# Patient Record
Sex: Female | Born: 1950 | Race: Black or African American | Hispanic: No | Smoking: Former smoker
Health system: Southern US, Community
[De-identification: ages and names within clinical notes are randomized; demographics above are authoritative.]

## PROBLEM LIST (undated history)

## (undated) DIAGNOSIS — R350 Frequency of micturition: Secondary | ICD-10-CM

## (undated) DIAGNOSIS — D649 Anemia, unspecified: Secondary | ICD-10-CM

## (undated) DIAGNOSIS — R3129 Other microscopic hematuria: Secondary | ICD-10-CM

## (undated) DIAGNOSIS — I10 Essential (primary) hypertension: Secondary | ICD-10-CM

## (undated) DIAGNOSIS — R32 Unspecified urinary incontinence: Secondary | ICD-10-CM

## (undated) HISTORY — DX: Unspecified urinary incontinence: R32

## (undated) HISTORY — DX: Frequency of micturition: R35.0

## (undated) HISTORY — DX: Other microscopic hematuria: R31.29

---

## 2011-06-06 ENCOUNTER — Ambulatory Visit: Payer: Self-pay | Admitting: Family Medicine

## 2013-12-03 ENCOUNTER — Ambulatory Visit: Payer: Self-pay | Admitting: Family Medicine

## 2013-12-14 ENCOUNTER — Ambulatory Visit: Payer: Self-pay | Admitting: Family Medicine

## 2014-07-15 ENCOUNTER — Other Ambulatory Visit: Payer: Self-pay | Admitting: Urology

## 2014-07-15 DIAGNOSIS — R3129 Other microscopic hematuria: Secondary | ICD-10-CM

## 2014-07-21 ENCOUNTER — Other Ambulatory Visit: Payer: Self-pay | Admitting: *Deleted

## 2014-07-21 ENCOUNTER — Ambulatory Visit
Admission: RE | Admit: 2014-07-21 | Discharge: 2014-07-21 | Disposition: A | Discharge status: Home / Self Care | Payer: PPO | Source: Ambulatory Visit | Attending: Urology | Admitting: Urology

## 2014-07-21 ENCOUNTER — Encounter: Payer: Self-pay | Admitting: *Deleted

## 2014-07-21 DIAGNOSIS — N200 Calculus of kidney: Secondary | ICD-10-CM | POA: Insufficient documentation

## 2014-07-21 DIAGNOSIS — M5137 Other intervertebral disc degeneration, lumbosacral region: Secondary | ICD-10-CM | POA: Diagnosis not present

## 2014-07-21 DIAGNOSIS — R3129 Other microscopic hematuria: Secondary | ICD-10-CM

## 2014-07-21 DIAGNOSIS — D649 Anemia, unspecified: Secondary | ICD-10-CM | POA: Insufficient documentation

## 2014-07-21 DIAGNOSIS — I7090 Unspecified atherosclerosis: Secondary | ICD-10-CM | POA: Insufficient documentation

## 2014-07-21 DIAGNOSIS — Z5320 Procedure and treatment not carried out because of patient's decision for unspecified reasons: Secondary | ICD-10-CM | POA: Insufficient documentation

## 2014-07-21 DIAGNOSIS — R312 Other microscopic hematuria: Secondary | ICD-10-CM | POA: Diagnosis present

## 2014-07-21 DIAGNOSIS — I10 Essential (primary) hypertension: Secondary | ICD-10-CM | POA: Insufficient documentation

## 2014-07-21 HISTORY — DX: Essential (primary) hypertension: I10

## 2014-07-21 MED ORDER — IOHEXOL 300 MG/ML  SOLN
125.0000 mL | Freq: Once | INTRAMUSCULAR | Status: AC | PRN
  Administered 2014-07-21: 150 mL via INTRAVENOUS

## 2014-07-28 ENCOUNTER — Encounter: Payer: Self-pay | Admitting: Urology

## 2014-07-28 ENCOUNTER — Ambulatory Visit (INDEPENDENT_AMBULATORY_CARE_PROVIDER_SITE_OTHER): Payer: PPO | Admitting: Urology

## 2014-07-28 VITALS — BP 117/78 | HR 79 | Ht 63.0 in | Wt 180.7 lb

## 2014-07-28 DIAGNOSIS — R3129 Other microscopic hematuria: Secondary | ICD-10-CM

## 2014-07-28 DIAGNOSIS — R312 Other microscopic hematuria: Secondary | ICD-10-CM

## 2014-07-28 DIAGNOSIS — R300 Dysuria: Secondary | ICD-10-CM

## 2014-07-28 LAB — URINALYSIS, COMPLETE
BILIRUBIN UA: NEGATIVE
GLUCOSE, UA: NEGATIVE
Ketones, UA: NEGATIVE
Leukocytes, UA: NEGATIVE
NITRITE UA: NEGATIVE
SPEC GRAV UA: 1.025 (ref 1.005–1.030)
Urobilinogen, Ur: 0.2 mg/dL (ref 0.2–1.0)
pH, UA: 5.5 (ref 5.0–7.5)

## 2014-07-28 LAB — MICROSCOPIC EXAMINATION: Bacteria, UA: NONE SEEN

## 2014-07-28 MED ORDER — URIBEL 118 MG PO CAPS
118.0000 mg | ORAL_CAPSULE | Freq: Three times a day (TID) | ORAL | Status: DC
Start: 2014-07-28 — End: 2014-09-13

## 2014-07-28 NOTE — Progress Notes (Signed)
   Subjective:    Patient ID: Barbara Lewis, female    DOB: May 07, 1950, 64 y.o.   MRN: [id number]  HPI Patient is a 64 year old female with a smoking history of 30 pack years of smoking [place] unfiltered cigarettes. She presents with number of small growths on the posterior portion of her bladder there will need to be biopsied. On the trigone is an area of erythema that I will treat with URI BEL. Otherwise the bladder appeared normal the ureters were in normal position with a clear reflux of urine through each her etc. be in my body of my report  Review of Systems  genitourinary-microhematuria urgency frequency and dysuria Abdominal-no constipation diarrhea or food intolerance Psychiatric no depression positive for anxiety        Physical Exam  Constitutional: She appears well-developed and well-nourished.  Abdominal: Soft. Bowel sounds are normal.  Genitourinary: Vagina normal.  Musculoskeletal: She exhibits no edema or tenderness.      Cystoscopy Procedure Note  Patient identification was confirmed, informed consent was obtained, and patient was prepped using Betadine solution.  Lidocaine jelly was administered per urethral meatus.    Preoperative abx where received prior to procedure.    Procedure: - Flexible cystoscope introduced, without any difficulty.   - Thorough search of the bladder revealed:    normal urethral meatus   Erythematous urothelium and urothelium that is normal on the trigone and bladder. 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  Post-Procedure: - Patient tolerated the procedure well        Assessment & Plan:  As the patient has a 3 tumors the posterior wall I will put her on the OR schedule for outpatient cystoscopy cystoscopy bladder biopsy. She has been  put on your about for the irritation to her trigone and the dysuria created by this irritation.

## 2014-08-02 ENCOUNTER — Other Ambulatory Visit: Payer: Self-pay | Admitting: Urology

## 2014-08-03 NOTE — Telephone Encounter (Signed)
I spoke w/ Mr. Nored and notified him that his [place] will be a phone encounter scheduled for Monday, July 11th 9:00-12:00pm. He verbally express understanding.

## 2014-09-06 ENCOUNTER — Encounter
Admission: RE | Admit: 2014-09-06 | Discharge: 2014-09-06 | Disposition: A | Discharge status: Home / Self Care | Payer: PPO | Source: Ambulatory Visit | Attending: Urology | Admitting: Urology

## 2014-09-06 DIAGNOSIS — Z01810 Encounter for preprocedural cardiovascular examination: Secondary | ICD-10-CM | POA: Diagnosis present

## 2014-09-06 DIAGNOSIS — N329 Bladder disorder, unspecified: Secondary | ICD-10-CM | POA: Insufficient documentation

## 2014-09-06 DIAGNOSIS — I10 Essential (primary) hypertension: Secondary | ICD-10-CM | POA: Diagnosis not present

## 2014-09-06 DIAGNOSIS — Z01812 Encounter for preprocedural laboratory examination: Secondary | ICD-10-CM | POA: Insufficient documentation

## 2014-09-06 HISTORY — DX: Anemia, unspecified: D64.9

## 2014-09-06 LAB — POTASSIUM: Potassium: 4.1 mmol/L (ref 3.5–5.1)

## 2014-09-06 NOTE — Patient Instructions (Addendum)
  Your procedure is scheduled on:09/13/14 Report to Day Surgery. [facility] To find out your arrival time please call [phone] between 1PM - 3PM on 09/10/14 Remember: Instructions that are not followed completely may result in serious medical risk, up to and including death, or upon the discretion of your surgeon and anesthesiologist your surgery may need to be rescheduled.    _X__ 1. Do not eat food or drink liquids after midnight. No gum chewing or hard candies.     __X__ 2. No Alcohol for 24 hours before or after surgery.   ____ 3. Bring all medications with you on the day of surgery if instructed.    __X__ 4. Notify your doctor if there is any change in your medical condition     (cold, fever, infections).     Do not wear jewelry, make-up, hairpins, clips or nail polish.  Do not wear lotions, powders, or perfumes. You may wear deodorant.  Do not shave 48 hours prior to surgery. Men may shave face and neck.  Do not bring valuables to the hospital.    [facility] is not responsible for any belongings or valuables.               Contacts, dentures or bridgework may not be worn into surgery.  Leave your suitcase in the car. After surgery it may be brought to your room.  For patients admitted to the hospital, discharge time is determined by your                treatment team.   Patients discharged the day of surgery will not be allowed to drive home.   Please read over the following fact sheets that you were given:   Surgical Site Infection Prevention   ____ Take these medicines the morning of surgery with A SIP OF WATER:    1.   2.   3.   4.  5.  6.  ____ Fleet Enema (as directed)   ____ Use CHG Soap as directed  ____ Use inhalers on the day of surgery  ____ Stop metformin 2 days prior to surgery    ____ Take 1/2 of usual insulin dose the night before surgery and none on the morning of surgery.   ____ Stop Coumadin/Plavix/aspirin on  ____ Stop  Anti-inflammatories on   ____ Stop supplements until after surgery.    ____ Bring C-Pap to the hospital. .

## 2014-09-07 NOTE — Pre-Procedure Instructions (Signed)
EKG sent to anesthesia for review, per Dr. Laureen Abrahams " IRBBB, [place] for surgery".

## 2014-09-13 ENCOUNTER — Encounter
Admission: RE | Disposition: A | Discharge status: Home / Self Care | Payer: Self-pay | Source: Ambulatory Visit | Attending: Urology

## 2014-09-13 ENCOUNTER — Ambulatory Visit: Payer: PPO | Admitting: Anesthesiology

## 2014-09-13 ENCOUNTER — Ambulatory Visit
Admission: RE | Admit: 2014-09-13 | Discharge: 2014-09-13 | Disposition: A | Discharge status: Home / Self Care | Payer: PPO | Source: Ambulatory Visit | Attending: Urology | Admitting: Urology

## 2014-09-13 DIAGNOSIS — I10 Essential (primary) hypertension: Secondary | ICD-10-CM | POA: Diagnosis not present

## 2014-09-13 DIAGNOSIS — Z79899 Other long term (current) drug therapy: Secondary | ICD-10-CM | POA: Insufficient documentation

## 2014-09-13 DIAGNOSIS — N3021 Other chronic cystitis with hematuria: Secondary | ICD-10-CM | POA: Diagnosis not present

## 2014-09-13 DIAGNOSIS — D494 Neoplasm of unspecified behavior of bladder: Secondary | ICD-10-CM | POA: Diagnosis present

## 2014-09-13 DIAGNOSIS — Z87891 Personal history of nicotine dependence: Secondary | ICD-10-CM | POA: Diagnosis not present

## 2014-09-13 DIAGNOSIS — R350 Frequency of micturition: Secondary | ICD-10-CM | POA: Diagnosis not present

## 2014-09-13 DIAGNOSIS — R32 Unspecified urinary incontinence: Secondary | ICD-10-CM | POA: Insufficient documentation

## 2014-09-13 DIAGNOSIS — D649 Anemia, unspecified: Secondary | ICD-10-CM | POA: Diagnosis not present

## 2014-09-13 DIAGNOSIS — N3020 Other chronic cystitis without hematuria: Secondary | ICD-10-CM | POA: Diagnosis not present

## 2014-09-13 HISTORY — PX: CYSTOSCOPY WITH BIOPSY: SHX5122

## 2014-09-13 SURGERY — CYSTOSCOPY, WITH BIOPSY
Anesthesia: General | Wound class: Clean Contaminated

## 2014-09-13 MED ORDER — BELLADONNA ALKALOIDS-OPIUM 16.2-60 MG RE SUPP
RECTAL | Status: DC | PRN
  Administered 2014-09-13: 1 via RECTAL

## 2014-09-13 MED ORDER — MIDAZOLAM HCL 2 MG/2ML IJ SOLN
INTRAMUSCULAR | Status: DC | PRN
  Administered 2014-09-13: 2 mg via INTRAVENOUS

## 2014-09-13 MED ORDER — LIDOCAINE HCL (PF) 1 % IJ SOLN
INTRAMUSCULAR | Status: AC
  Administered 2014-09-13: 2 mL
  Filled 2014-09-13: qty 2

## 2014-09-13 MED ORDER — PROPOFOL 10 MG/ML IV BOLUS
INTRAVENOUS | Status: DC | PRN
Start: 2014-09-13 — End: 2014-09-13
  Administered 2014-09-13: 150 mg via INTRAVENOUS

## 2014-09-13 MED ORDER — URIBEL 118 MG PO CAPS: 118.0000 mg | ORAL_CAPSULE | Freq: Three times a day (TID) | ORAL | Status: DC

## 2014-09-13 MED ORDER — FAMOTIDINE 20 MG PO TABS: 20.0000 mg | ORAL_TABLET | Freq: Once | ORAL | Status: DC

## 2014-09-13 MED ORDER — TRAMADOL HCL 50 MG PO TABS
ORAL_TABLET | ORAL | Status: AC
  Administered 2014-09-13: 50 mg
  Filled 2014-09-13: qty 1

## 2014-09-13 MED ORDER — PHENYLEPHRINE HCL 10 MG/ML IJ SOLN
INTRAMUSCULAR | Status: DC | PRN
  Administered 2014-09-13 (×2): 100 ug via INTRAVENOUS

## 2014-09-13 MED ORDER — LACTATED RINGERS IV SOLN
INTRAVENOUS | Status: DC
  Administered 2014-09-13: 14:00:00 via INTRAVENOUS

## 2014-09-13 MED ORDER — TRAMADOL HCL 50 MG PO TABS
50.0000 mg | ORAL_TABLET | Freq: Once | ORAL | Status: AC
  Administered 2014-09-13: 50 mg via ORAL

## 2014-09-13 MED ORDER — OXYCODONE-ACETAMINOPHEN 7.5-325 MG PO TABS: 1.0000 | ORAL_TABLET | Freq: Four times a day (QID) | ORAL | Status: DC | PRN

## 2014-09-13 MED ORDER — FENTANYL CITRATE (PF) 100 MCG/2ML IJ SOLN
INTRAMUSCULAR | Status: DC | PRN
  Administered 2014-09-13: 25 ug via INTRAVENOUS
  Administered 2014-09-13: 50 ug via INTRAVENOUS

## 2014-09-13 MED ORDER — BUPIVACAINE HCL 0.5 % IJ SOLN
INTRAMUSCULAR | Status: DC | PRN
  Administered 2014-09-13: 30 mL

## 2014-09-13 MED ORDER — DEXAMETHASONE SODIUM PHOSPHATE 4 MG/ML IJ SOLN
INTRAMUSCULAR | Status: DC | PRN
  Administered 2014-09-13: 10 mg via INTRAVENOUS

## 2014-09-13 MED ORDER — FAMOTIDINE 20 MG PO TABS
ORAL_TABLET | ORAL | Status: AC
  Administered 2014-09-13: 20 mg
  Filled 2014-09-13: qty 1

## 2014-09-13 MED ORDER — ONDANSETRON HCL 4 MG/2ML IJ SOLN: 4.0000 mg | Freq: Once | INTRAMUSCULAR | Status: DC | PRN

## 2014-09-13 MED ORDER — FENTANYL CITRATE (PF) 100 MCG/2ML IJ SOLN: 25.0000 ug | INTRAMUSCULAR | Status: DC | PRN

## 2014-09-13 MED ORDER — ONDANSETRON HCL 4 MG/2ML IJ SOLN
INTRAMUSCULAR | Status: DC | PRN
  Administered 2014-09-13: 4 mg via INTRAVENOUS

## 2014-09-13 MED ORDER — LACTATED RINGERS IV SOLN
INTRAVENOUS | Status: DC | PRN
  Administered 2014-09-13: 16:00:00 via INTRAVENOUS

## 2014-09-13 MED ORDER — LIDOCAINE HCL (CARDIAC) 20 MG/ML IV SOLN
INTRAVENOUS | Status: DC | PRN
  Administered 2014-09-13: 80 mg via INTRAVENOUS

## 2014-09-13 MED ORDER — STERILE WATER FOR IRRIGATION IR SOLN
Status: DC | PRN
  Administered 2014-09-13: 1500 mL

## 2014-09-13 MED ORDER — LISINOPRIL-HYDROCHLOROTHIAZIDE 10-12.5 MG PO TABS: 1.0000 | ORAL_TABLET | Freq: Every day | ORAL | Status: DC

## 2014-09-13 MED ORDER — BELLADONNA ALKALOIDS-OPIUM 16.2-60 MG RE SUPP
RECTAL | Status: AC
  Filled 2014-09-13: qty 1

## 2014-09-13 SURGICAL SUPPLY — 28 items
BAG DRAIN CYSTO-URO LG1000N (MISCELLANEOUS) ×3 IMPLANT
BAG URO DRAIN 2000ML W/SPOUT (MISCELLANEOUS) ×3 IMPLANT
CATH FOL LEG HOLDER (MISCELLANEOUS) ×3 IMPLANT
CATH FOLEY 2WAY  5CC 16FR (CATHETERS) ×2
CATH URTH 16FR FL 2W BLN LF (CATHETERS) ×1 IMPLANT
DRESSING TELFA 4X3 1S ST N-ADH (GAUZE/BANDAGES/DRESSINGS) ×3 IMPLANT
ELECT URO BUGBEE 5F (MISCELLANEOUS) ×3
ELECTRODE URO BUGBEE 5F (MISCELLANEOUS) ×1 IMPLANT
GLOVE BIO SURGEON STRL SZ7 (GLOVE) ×6 IMPLANT
GLOVE BIO SURGEON STRL SZ7.5 (GLOVE) ×3 IMPLANT
GOWN STRL REUS W/ TWL LRG LVL3 (GOWN DISPOSABLE) ×1 IMPLANT
GOWN STRL REUS W/ TWL XL LVL3 (GOWN DISPOSABLE) ×1 IMPLANT
GOWN STRL REUS W/TWL LRG LVL3 (GOWN DISPOSABLE) ×2
GOWN STRL REUS W/TWL XL LVL3 (GOWN DISPOSABLE) ×2
JELLY LUB 2OZ STRL (MISCELLANEOUS) ×2
JELLY LUBE 2OZ STRL (MISCELLANEOUS) ×1 IMPLANT
KIT RM TURNOVER CYSTO AR (KITS) ×3 IMPLANT
NDL SAFETY ECLIPSE 18X1.5 (NEEDLE) ×1 IMPLANT
NEEDLE HYPO 18GX1.5 SHARP (NEEDLE) ×2
PACK CYSTO AR (MISCELLANEOUS) ×3 IMPLANT
PAD GROUND ADULT SPLIT (MISCELLANEOUS) ×3 IMPLANT
PREP PVP WINGED SPONGE (MISCELLANEOUS) ×3 IMPLANT
SET IRRIG Y TYPE TUR BLADDER L (SET/KITS/TRAYS/PACK) ×3 IMPLANT
SOL PREP PVP 2OZ (MISCELLANEOUS) ×3
SOLUTION PREP PVP 2OZ (MISCELLANEOUS) ×1 IMPLANT
SYRINGE IRR TOOMEY STRL 70CC (SYRINGE) ×3 IMPLANT
WATER STERILE IRR 1000ML POUR (IV SOLUTION) ×3 IMPLANT
WATER STERILE IRR 3000ML UROMA (IV SOLUTION) ×3 IMPLANT

## 2014-09-13 NOTE — Discharge Instructions (Signed)
General Anesthesia °General anesthesia is a sleep-like state of non-feeling produced by medicines (anesthetics). General anesthesia prevents you from being alert and feeling pain during a medical procedure. Your caregiver may recommend general anesthesia if your procedure: °· Is long. °· Is painful or uncomfortable. °· Would be frightening to see or hear. °· Requires you to be still. °· Affects your breathing. °· Causes significant blood loss. °LET YOUR CAREGIVER KNOW ABOUT: °· Allergies to food or medicine. °· Medicines taken, including vitamins, herbs, eyedrops, over-the-counter medicines, and creams. °· Use of steroids (by mouth or creams). °· Previous problems with anesthetics or numbing medicines, including problems experienced by relatives. °· History of bleeding problems or blood clots. °· Previous surgeries and types of anesthetics received. °· Possibility of pregnancy, if this applies. °· Use of cigarettes, alcohol, or illegal drugs. °· Any health condition(s), especially diabetes, sleep apnea, and high blood pressure. °RISKS AND COMPLICATIONS °General anesthesia rarely causes complications. However, if complications do occur, they can be life threatening. Complications include: °· A lung infection. °· A stroke. °· A heart attack. °· Waking up during the procedure. When this occurs, the patient may be unable to move and communicate that he or she is awake. The patient may feel severe pain. °Older adults and adults with serious medical problems are more likely to have complications than adults who are young and healthy. Some complications can be prevented by answering all of your caregiver's questions thoroughly and by following all pre-procedure instructions. It is important to tell your caregiver if any of the pre-procedure instructions, especially those related to diet, were not followed. Any food or liquid in the stomach can cause problems when you are under general anesthesia. °BEFORE THE  PROCEDURE °· Ask your caregiver if you will have to spend the night at the hospital. If you will not have to spend the night, arrange to have an adult drive you and stay with you for 24 hours. °· Follow your caregiver's instructions if you are taking dietary supplements or medicines. Your caregiver may tell you to stop taking them or to reduce your dosage. °· Do not smoke for as long as possible before your procedure. If possible, stop smoking 3-6 weeks before the procedure. °· Do not take new dietary supplements or medicines within 1 week of your procedure unless your caregiver approves them. °· Do not eat within 8 hours of your procedure or as directed by your caregiver. Drink only clear liquids, such as water, black coffee (without milk or cream), and fruit juices (without pulp). °· Do not drink within 3 hours of your procedure or as directed by your caregiver. °· You may brush your teeth on the morning of the procedure, but make sure to spit out the toothpaste and water when finished. °PROCEDURE  °You will receive anesthetics through a mask, through an intravenous (IV) access tube, or through both. A doctor who specializes in anesthesia (anesthesiologist) or a nurse who specializes in anesthesia (nurse anesthetist) or both will stay with you throughout the procedure to make sure you remain unconscious. He or she will also watch your blood pressure, pulse, and oxygen levels to make sure that the anesthetics do not cause any problems. Once you are asleep, a breathing tube or mask may be used to help you breathe. °AFTER THE PROCEDURE °You will wake up after the procedure is complete. You may be in the room where the procedure was performed or in a recovery area. You may have a sore throat if   a breathing tube was used. You may also feel: °· Dizzy. °· Weak. °· Drowsy. °· Confused. °· Nauseous. °· Cold. °These are all normal responses and can be expected to last for up to 24 hours after the procedure is complete. A  caregiver will tell you when you are ready to go home. This will usually be when you are fully awake and in stable condition. °Document Released: 05/22/2007 Document Revised: 06/29/2013 Document Reviewed: 06/13/2011 °ExitCare® Patient Information ©2015 [employer]. This information is not intended to replace advice given to you by your health care provider. Make sure you discuss any questions you have with your health care provider. ° °

## 2014-09-13 NOTE — Anesthesia Procedure Notes (Signed)
Procedure Name: LMA Insertion Date/Time: 09/13/2014 4:18 PM Performed by: Silvana Newness Pre-anesthesia Checklist: Patient identified, Emergency Drugs available, Suction available, Patient being monitored and Timeout performed Patient Re-evaluated:Patient Re-evaluated prior to inductionOxygen Delivery Method: Circle system utilized Preoxygenation: Pre-oxygenation with 100% oxygen Intubation Type: IV induction Ventilation: Mask ventilation without difficulty LMA: LMA inserted LMA Size: 3.5 Placement Confirmation: positive ETCO2 and breath sounds checked- equal and bilateral Tube secured with: Tape Dental Injury: Teeth and Oropharynx as per pre-operative assessment

## 2014-09-13 NOTE — Op Note (Signed)
Preoperative bladder tumor postop same  Procedure bladder tumor biopsy and fulguration  Anesthesia Gen.  The patient sterilely prepped and draped in supine lithotomy position under good relaxation from general anesthetic and after an appropriate timeout the patient is cystoscope. 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 The ureteral orifices appeared normal in this area was biopsied 2 with biopsy forceps and fulgurated with the Bugbee electrode 5 [place]. Bleeding is completely controlled. Rectal exam is negative. The end of the procedure with bleeding controlled the bladder is emptied and 30 mL of half percent plain Marcaine placed in the bladder with a B&O suppository. Patient be seen 1 week to follow up results of biopsy.

## 2014-09-13 NOTE — H&P (Signed)
 @  LAGTXMI@ 2:24 PM   Barbara Lewis [birth year] [id number]  Referring provider: No referring provider defined for this encounter.  No chief complaint on file.   HPI: Suspicious area of bladder for biopsy    PMH: Past Medical History  Diagnosis Date  . Hypertension   . Hematuria, microscopic   . Urinary incontinence   . Urinary frequency   . Anemia     Surgical History: No past surgical history on file.  Home Medications:    Medication List    ASK your doctor about these medications        lisinopril-hydrochlorothiazide 10-12.5 MG per tablet  Commonly known as:  PRINZIDE,ZESTORETIC  TAKE 1 TABLET BY MOUTH ONCE A DAY     URIBEL 118 MG Caps  Take 1 capsule (118 mg total) by mouth 4 (four) times daily -  with meals and at bedtime.        Allergies: No Known Allergies  Family History: Family History  Problem Relation Age of Onset  . Kidney cancer Neg Hx   . Prostate cancer Neg Hx   . Dementia Mother     Social History:  reports that she quit smoking about 4 years ago. She does not have any smokeless tobacco history on file. She reports that she drinks about 0.6 oz of alcohol per week. She reports that she does not use illicit drugs.  MGN:[id number]  anemia  GU frequency, urgency     cardiovascular  hypertebsuib                                   Physical Exam: BP 117/74 mmHg  Temp(Src) 98.3 F (36.8 C) (Oral)  Resp 16  SpO2 100%  LMP 07/20/2004  Constitutional:  Alert and oriented, No acute distress. HEENT: [place] AT, moist mucus membranes.  Trachea midline, no masses. Cardiovascular: No clubbing, cyanosis, or edema. Respiratory: Normal respiratory effort, no increased work of breathing. GI: Abdomen is soft, nontender, nondistended, no abdominal masses GU: No CVA tenderness. Suprapubic tenderness Skin: No rashes, bruises or suspicious lesions. Lymph: No cervical or inguinal adenopathy. Neurologic: Grossly intact, no focal  deficits, moving all 4 extremities. Psychiatric: Normal mood and affect.  Laboratory Data: No results found for: WBC, HGB, HCT, MCV, PLT  No results found for: CREATININE  No results found for: PSA  No results found for: TESTOSTERONE  No results found for: HGBA1C  Urinalysis    Component Value Date/Time   GLUCOSEU Negative 07/28/2014 1541   BILIRUBINUR Negative 07/28/2014 1541   NITRITE Negative 07/28/2014 1541   LEUKOCYTESUR Negative 07/28/2014 1541    Pertinent Imaging:none  Assessment & Plan:  Cystoscopy and bladder biopsy  @DIAGMED @  No Follow-up on file.  Collier Flowers, [facility] [address] [place], [place] [address] [phone]

## 2014-09-13 NOTE — Anesthesia Preprocedure Evaluation (Signed)
Anesthesia Evaluation  Patient identified by MRN, date of birth, ID band Patient awake    Reviewed: Allergy & Precautions, H&P , NPO status , Patient's Chart, lab work & pertinent test results, reviewed documented beta blocker date and time   Airway Mallampati: II  TM Distance: >3 FB Neck ROM: full    Dental  (+) Teeth Intact   Pulmonary former smoker,          Cardiovascular Exercise Tolerance: Good hypertension, Normal cardiovascular examRhythm:regular Rate:Normal     Neuro/Psych    GI/Hepatic   Endo/Other    Renal/GU      Musculoskeletal   Abdominal   Peds  Hematology  (+) anemia ,   Anesthesia Other Findings   Reproductive/Obstetrics                             Anesthesia Physical Anesthesia Plan  ASA: II  Anesthesia Plan: General LMA   Post-op Pain Management:    Induction:   Airway Management Planned:   Additional Equipment:   Intra-op Plan:   Post-operative Plan:   Informed Consent: I have reviewed the patients History and Physical, chart, labs and discussed the procedure including the risks, benefits and alternatives for the proposed anesthesia with the patient or authorized representative who has indicated his/her understanding and acceptance.     Plan Discussed with: CRNA  Anesthesia Plan Comments:         Anesthesia Quick Evaluation

## 2014-09-13 NOTE — Transfer of Care (Signed)
Immediate Anesthesia Transfer of Care Note  Patient: Barbara Lewis  Procedure(s) Performed: Procedure(s): CYSTOSCOPY WITH BIOPSY (N/A)  Patient Location: PACU  Anesthesia Type:General  Level of Consciousness: awake, alert , oriented and patient cooperative  Airway & Oxygen Therapy: Patient Spontanous Breathing and Patient connected to face mask oxygen  Post-op Assessment: Report given to RN, Post -op Vital signs reviewed and stable and Patient moving all extremities X 4  Post vital signs: Reviewed and stable  Last Vitals:  Filed Vitals:   09/13/14 1653  BP: 106/53  Pulse: 65  Temp: 36.6 C  Resp: 15    Complications: No apparent anesthesia complications

## 2014-09-13 NOTE — Progress Notes (Signed)
Dr. Elnoria Howard called and will contact pt tonight at home to order pain med

## 2014-09-14 ENCOUNTER — Encounter: Payer: Self-pay | Admitting: Urology

## 2014-09-15 NOTE — Anesthesia Postprocedure Evaluation (Signed)
  Anesthesia Post-op Note  Patient: Barbara Lewis  Procedure(s) Performed: Procedure(s): CYSTOSCOPY WITH BIOPSY (N/A)  Anesthesia type:General LMA  Patient location: PACU  Post pain: Pain level controlled  Post assessment: Post-op Vital signs reviewed, Patient's Cardiovascular Status Stable, Respiratory Function Stable, Patent Airway and No signs of Nausea or vomiting  Post vital signs: Reviewed and stable  Last Vitals:  Filed Vitals:   09/13/14 1814  BP: 141/77  Pulse: 67  Temp:   Resp:     Level of consciousness: awake, alert  and patient cooperative  Complications: No apparent anesthesia complications

## 2014-09-17 LAB — SURGICAL PATHOLOGY

## 2014-09-20 ENCOUNTER — Telehealth: Payer: Self-pay | Admitting: Urology

## 2014-09-20 NOTE — Telephone Encounter (Signed)
Please call and let patient know that her bladder pathology was BENIGN.  She does not need to follow up tomorrow.  She probably should be seen in about 3-6 months with Dr. Elnoria Howard for repeat cysto to ensure area has cleared.    Please cancel f/u tomorrow and reschedule as above.   Hollice Espy, MD

## 2014-09-20 NOTE — Telephone Encounter (Signed)
Spoke with pt in reference to pathology. Pt was transferred to the front to make f/u appt with Dr. Elnoria Howard. Pt voiced understanding.

## 2014-09-21 ENCOUNTER — Encounter: Payer: Self-pay | Admitting: Urology

## 2014-09-21 ENCOUNTER — Ambulatory Visit: Payer: PPO | Admitting: Urology

## 2014-09-23 ENCOUNTER — Encounter: Payer: Self-pay | Admitting: Urology

## 2014-09-23 ENCOUNTER — Ambulatory Visit (INDEPENDENT_AMBULATORY_CARE_PROVIDER_SITE_OTHER): Payer: PPO | Admitting: Urology

## 2014-09-23 VITALS — BP 126/82 | HR 90 | Ht 63.0 in | Wt 179.4 lb

## 2014-09-23 DIAGNOSIS — R310 Gross hematuria: Secondary | ICD-10-CM | POA: Insufficient documentation

## 2014-09-23 LAB — URINALYSIS, COMPLETE
BILIRUBIN UA: NEGATIVE
Glucose, UA: NEGATIVE
NITRITE UA: POSITIVE — AB
PH UA: 5.5 (ref 5.0–7.5)
Specific Gravity, UA: 1.025 (ref 1.005–1.030)
UUROB: 2 mg/dL — AB (ref 0.2–1.0)

## 2014-09-23 LAB — MICROSCOPIC EXAMINATION: EPITHELIAL CELLS (NON RENAL): NONE SEEN /HPF (ref 0–10)

## 2014-09-23 MED ORDER — SULFAMETHOXAZOLE-TRIMETHOPRIM 800-160 MG PO TABS: 1.0000 | ORAL_TABLET | Freq: Two times a day (BID) | ORAL | Status: DC

## 2014-09-23 NOTE — Progress Notes (Signed)
09/23/2014 1:57 PM   Barbara Lewis 1950-03-05 [id number]  Referring provider: Dion Body, MD [place], [place] [address]  Chief Complaint  Patient presents with  . Hematuria    post bladder bx    HPI: Barbara Lewis is a 64 year old African American female who presents today complaining of gross hematuria.  Patient underwent cystoscopy in office with Dr. Elnoria Howard on 07/28/2014 and some bladder tumors were discovered.  She was then taken to the OR and the tumors were removed on 09/13/2014.  The pathology returned benign and patient was scheduled for a follow up cystoscopy in three months.  She did not have any complications after the biopsy, but two days ago she passed a clot and then as had gross hematuria ever since.  She is also experiencing frequent urination, urgency, dysuria, nocturia, leakage and lower back pain.  Her UA is nitrate positive with >30 RBC's/hpf and >30 WBC's/hpf with moderate bacteria. She has not had fevers, chills, nausea or vomiting.      PMH: Past Medical History  Diagnosis Date  . Hypertension   . Hematuria, microscopic   . Urinary incontinence   . Urinary frequency   . Anemia     Surgical History: Past Surgical History  Procedure Laterality Date  . Cystoscopy with biopsy N/A 09/13/2014    Procedure: CYSTOSCOPY WITH BIOPSY;  Surgeon: Collier Flowers, MD;  Location: [facility] ORS;  Service: Urology;  Laterality: N/A;    Home Medications:    Medication List       This list is accurate as of: 09/23/14  1:57 PM.  Always use your most recent med list.               lisinopril-hydrochlorothiazide 10-12.5 MG per tablet  Commonly known as:  PRINZIDE,ZESTORETIC  TAKE 1 TABLET BY MOUTH ONCE A DAY     oxyCODONE-acetaminophen 5-325 MG per tablet  Commonly known as:  PERCOCET/ROXICET  TAKE 1 TABLET BY MOUTH EVERY 6HRS AS NEEDED FOR PAIN     sulfamethoxazole-trimethoprim 800-160 MG per tablet  Commonly known as:  BACTRIM DS,SEPTRA DS    Take 1 tablet by mouth every 12 (twelve) hours.     traMADol 50 MG tablet  Commonly known as:  ULTRAM  TAKE 2 TABLETS EVERY 8 HOURS AS NEEDED FOR POST OP PAIN        Allergies: No Known Allergies  Family History: Family History  Problem Relation Age of Onset  . Kidney cancer Neg Hx   . Prostate cancer Neg Hx   . Dementia Mother     Social History:  reports that she quit smoking about 4 years ago. She does not have any smokeless tobacco history on file. She reports that she drinks about 0.6 oz of alcohol per week. She reports that she does not use illicit drugs.  ROS: UROLOGY Frequent Urination?: Yes Hard to postpone urination?: Yes Burning/pain with urination?: Yes Get up at night to urinate?: Yes Leakage of urine?: Yes Urine stream starts and stops?: No Trouble starting stream?: No Do you have to strain to urinate?: No Blood in urine?: Yes Urinary tract infection?: No Sexually transmitted disease?: No Injury to kidneys or bladder?: No Painful intercourse?: No Weak stream?: No Currently pregnant?: Yes Vaginal bleeding?: No Last menstrual period?: 2006  Gastrointestinal Nausea?: No Vomiting?: No Indigestion/heartburn?: No Diarrhea?: No Constipation?: No  Constitutional Fever: No Night sweats?: Yes Weight loss?: No Fatigue?: Yes  Skin Skin rash/lesions?: No Itching?: No  Eyes Blurred vision?: No Double vision?: No  Ears/Nose/Throat Sore throat?: No Sinus problems?: No  Hematologic/Lymphatic Swollen glands?: No Easy bruising?: No  Cardiovascular Leg swelling?: No Chest pain?: No  Respiratory Cough?: No Shortness of breath?: No  Endocrine Excessive thirst?: No  Musculoskeletal Back pain?: No Joint pain?: No  Neurological Headaches?: No Dizziness?: Yes  Psychologic Depression?: No Anxiety?: No  Physical Exam: BP 126/82 mmHg  Pulse 90  Ht 5\' 3"  (1.6 m)  Wt 179 lb 6.4 oz (81.375 kg)  BMI 31.79 kg/m2  LMP 07/20/2004    Laboratory Data: Results for orders placed or performed in visit on 09/23/14  CULTURE, URINE COMPREHENSIVE  Result Value Ref Range   Urine Culture, Comprehensive Final report (A)    Result 1 Escherichia coli (A)    ANTIMICROBIAL SUSCEPTIBILITY Comment   Microscopic Examination  Result Value Ref Range   WBC, UA >30W 0 -  5 /hpf   RBC, UA >30R 0 -  2 /hpf   Epithelial Cells (non renal) None seen 0 - 10 /hpf   Bacteria, UA Moderate (A) None seen/Few  Urinalysis, Complete  Result Value Ref Range   Specific Gravity, UA 1.025 1.005 - 1.030   pH, UA 5.5 5.0 - 7.5   Color, UA Brown (A) Yellow   Appearance Ur Turbid (A) Clear   Leukocytes, UA 1+ (A) Negative   Protein, UA 3+ (A) Negative/Trace   Glucose, UA Negative Negative   Ketones, UA Trace (A) Negative   RBC, UA 3+ (A) Negative   Bilirubin, UA Negative Negative   Urobilinogen, Ur 2.0 (H) 0.2 - 1.0 mg/dL   Nitrite, UA Positive (A) Negative   Microscopic Examination See below:    No results found for: WBC, HGB, HCT, MCV, PLT  No results found for: CREATININE  No results found for: PSA  No results found for: TESTOSTERONE  No results found for: HGBA1C  Urinalysis    Component Value Date/Time   GLUCOSEU Negative 09/23/2014 0931   BILIRUBINUR Negative 09/23/2014 0931   NITRITE Positive* 09/23/2014 0931   LEUKOCYTESUR 1+* 09/23/2014 0931    Pertinent Imaging: CLINICAL DATA: Pelvic pain. Microscopic hematuria and dysuria.  EXAM: CT ABDOMEN AND PELVIS WITHOUT AND WITH CONTRAST  TECHNIQUE: Multidetector CT imaging of the abdomen and pelvis was performed following the standard protocol before and following the bolus administration of intravenous contrast.  CONTRAST: 120mL OMNIPAQUE IOHEXOL 300 MG/ML SOLN  COMPARISON: None.  FINDINGS: Lower chest: Unremarkable  Hepatobiliary: Mild common bile duct dilatation at 8 mm.  Pancreas: Unremarkable  Spleen: Unremarkable  Adrenals/Urinary Tract: No  abnormal renal parenchymal enhancement, or abnormal urographic phase filling defect along the urothelium. No urinary bladder wall thickening.  There separate 1 mm calculi in the left kidney lower pole (image 83, series 7) and left mid kidney (image 85, series 7). A a  Stomach/Bowel: Unremarkable  Vascular/Lymphatic: Aortoiliac atherosclerotic vascular disease.  Reproductive: Unremarkable  Other: No supplemental non-categorized findings.  Musculoskeletal: Lumbar spondylosis and degenerative disc disease causing left foraminal impingement at L3-4, bilateral foraminal impingement at L4-5 and L5-S1, and central narrowing of the thecal sac at L3-4. A left paracentral disc protrusion may also be causing left subarticular lateral recess stenosis at L3-4.  IMPRESSION: 1. Nonobstructive left nephrolithiasis. 2. Mild common bile duct dilatation at 8 mm, significance uncertain. 3. Aortoiliac atherosclerotic vascular disease. 4. Lumbar spondylosis and degenerative disc disease, causing impingement at L3-4, L4-5, and L5-S1.   Electronically Signed  By: Van Clines M.D.  On: 07/21/2014  11:16  Assessment & Plan:    1. Gross hematuria:   UA is suspicious for infection.  We discussed waiting for the urine culture results before starting an antibiotic vs starting one empirically.  She stated she was too uncomfortable to wait the three days for the results.  I will start Septra DS at this time and we will adjust as necessary when sensitives are available.   - Urinalysis, Complete   Return for keep follow up in December.  Zara Council, [facility] [address] [place], [place] [address] [phone]

## 2014-09-25 LAB — CULTURE, URINE COMPREHENSIVE

## 2014-09-27 ENCOUNTER — Telehealth: Payer: Self-pay | Admitting: *Deleted

## 2014-09-27 NOTE — Telephone Encounter (Signed)
-----   Message from Nori Riis, PA-C sent at 09/25/2014  5:42 PM EDT ----- Patient has a +UCx.  They need to continue Septra one  twice daily for seven days and then we need to check a clean catch specimen in 3 to 5 days after they complete their antibiotics.

## 2014-09-28 ENCOUNTER — Other Ambulatory Visit: Payer: Self-pay

## 2014-09-28 DIAGNOSIS — N390 Urinary tract infection, site not specified: Secondary | ICD-10-CM

## 2014-09-28 DIAGNOSIS — R319 Hematuria, unspecified: Principal | ICD-10-CM

## 2014-09-28 NOTE — Telephone Encounter (Signed)
I spoke with the pt: She will finish the Septra Rx on 09/30/14.  She is scheduled for a lab f/u on 10/06/14 to give another urine sample.  The pt indicated understanding and agreed to the f/u lab appt. . .  sm

## 2014-10-01 ENCOUNTER — Telehealth: Payer: Self-pay

## 2014-10-01 NOTE — Telephone Encounter (Signed)
Pt called stating she was still having burning and pain on urination after completing abt. Per Larene Beach pt was advised to get cystex plus OTC. Pt voiced understanding.

## 2014-10-06 ENCOUNTER — Other Ambulatory Visit: Payer: PPO

## 2014-10-06 DIAGNOSIS — R319 Hematuria, unspecified: Principal | ICD-10-CM

## 2014-10-06 DIAGNOSIS — N390 Urinary tract infection, site not specified: Secondary | ICD-10-CM

## 2014-10-06 LAB — URINALYSIS, COMPLETE
Bilirubin, UA: NEGATIVE
Glucose, UA: NEGATIVE
KETONES UA: NEGATIVE
Nitrite, UA: NEGATIVE
PH UA: 6 (ref 5.0–7.5)
Specific Gravity, UA: 1.025 (ref 1.005–1.030)
Urobilinogen, Ur: 0.2 mg/dL (ref 0.2–1.0)

## 2014-10-06 LAB — MICROSCOPIC EXAMINATION

## 2014-10-08 LAB — CULTURE, URINE COMPREHENSIVE

## 2014-10-11 ENCOUNTER — Telehealth: Payer: Self-pay

## 2014-10-11 NOTE — Telephone Encounter (Signed)
-----   Message from Nori Riis, PA-C sent at 10/10/2014 10:41 PM EDT ----- Patient has microscopic hematuria which is normal after a bladder biopsy.  She needs to follow up in one year for office visit and UA.  She should RTC sooner if she develops gross hematuria or more irritative voiding symptoms.

## 2014-10-11 NOTE — Telephone Encounter (Signed)
LMOM with details

## 2014-10-11 NOTE — Telephone Encounter (Signed)
Spoke with pt in reference to urinary issues. Pt will come in tomorrow morning for a PVR.

## 2014-10-11 NOTE — Telephone Encounter (Signed)
We need to make sure she is emptying her bladder completely.  I would like her to come in for a PVR.

## 2014-10-11 NOTE — Telephone Encounter (Signed)
Pt called stating she is continuing to have a lot pressure in pelvic region, burning on urination, frequency, very little amounts of urine excreted at night, and pt c/o a sharpe debilitating pain in vaginal area at the end of urinary stream, 10/10 pain scale. Pt states urinary stream is getting weaker by the day. Pt had a voided urine cx done last week. -these symptoms have started since having bladder bx. Please advise.

## 2014-10-12 ENCOUNTER — Ambulatory Visit (INDEPENDENT_AMBULATORY_CARE_PROVIDER_SITE_OTHER): Payer: PPO

## 2014-10-12 DIAGNOSIS — R310 Gross hematuria: Secondary | ICD-10-CM

## 2014-10-12 LAB — BLADDER SCAN AMB NON-IMAGING: SCAN RESULT: 30

## 2014-10-12 NOTE — Progress Notes (Signed)
Bladder Scan Patient can void: 70 ml Performed By: Toniann Fail, LPN  Per Larene Beach pt should make f/u appt with her to discuss further options.

## 2014-10-27 ENCOUNTER — Ambulatory Visit (INDEPENDENT_AMBULATORY_CARE_PROVIDER_SITE_OTHER): Payer: PPO | Admitting: Urology

## 2014-10-27 ENCOUNTER — Encounter: Payer: Self-pay | Admitting: Urology

## 2014-10-27 VITALS — BP 121/74 | HR 84 | Ht 63.0 in | Wt 176.0 lb

## 2014-10-27 DIAGNOSIS — R350 Frequency of micturition: Secondary | ICD-10-CM | POA: Diagnosis not present

## 2014-10-27 DIAGNOSIS — N952 Postmenopausal atrophic vaginitis: Secondary | ICD-10-CM

## 2014-10-27 DIAGNOSIS — R300 Dysuria: Secondary | ICD-10-CM

## 2014-10-27 LAB — URINALYSIS, COMPLETE
Bilirubin, UA: POSITIVE — AB
Glucose, UA: NEGATIVE
Ketones, UA: NEGATIVE
NITRITE UA: NEGATIVE
PH UA: 6 (ref 5.0–7.5)
Specific Gravity, UA: 1.015 (ref 1.005–1.030)
UUROB: 0.2 mg/dL (ref 0.2–1.0)

## 2014-10-27 LAB — BLADDER SCAN AMB NON-IMAGING: SCAN RESULT: 0

## 2014-10-27 LAB — MICROSCOPIC EXAMINATION: Bacteria, UA: NONE SEEN

## 2014-10-27 NOTE — Progress Notes (Signed)
10/27/2014 10:55 AM   Barbara Lewis 1950/10/30 [id number]  Referring provider: Dion Body, MD [place], [place] [address]  Chief Complaint  Patient presents with  . Dysuria    with frequency  Recheck  . Hematuria    HPI: Patient is a 64 year old African-American female who presents today for a several month history of frequent urination, urgency, urge incontinence, dysuria, nocturia and leakage of urine.  Previous history: She reports having burning with urination and sometimes in the absence of urination in the urethral area since at least November 2015. She also complains of a "strain" in her urethral area when ambulating. She sometimes has difficulty starting her urinary stream even when she has fairly significant urgency and has a sensation of incomplete bladder emptying at times. She gets up once nightly to urinate.   She has completed her microhematuria work up with CT Urogram and a bladder biopsy and fulguration.  She was found to have chronic cystitis and biopsy was negative for malignancy.    She does still have her uterus and ovaries. She's had 3 normal spontaneous vaginal deliveries. She does leak a very small amount of urine at times with laughing, coughing, and sneezing but does not wear pads and has minimal bother from this. She is menopausal and has regular hot flashes. She is not sexually active and denies vaginal dryness.  No issues with constipation or bowel movements.  She is a former smoker, quit 4 years ago but smoked a pack a day for at least 30 years.  Her UA is suspicious for infection, so I will send it for culture.  She has not had any fevers, chills, nausea or vomiting.    PMH: Past Medical History  Diagnosis Date  . Hypertension   . Hematuria, microscopic   . Urinary incontinence   . Urinary frequency   . Anemia     Surgical History: Past Surgical History  Procedure Laterality Date  . Cystoscopy with  biopsy N/A 09/13/2014    Procedure: CYSTOSCOPY WITH BIOPSY;  Surgeon: Collier Flowers, MD;  Location: [facility] ORS;  Service: Urology;  Laterality: N/A;    Home Medications:    Medication List       This list is accurate as of: 10/27/14 10:55 AM.  Always use your most recent med list.               lisinopril-hydrochlorothiazide 10-12.5 MG per tablet  Commonly known as:  PRINZIDE,ZESTORETIC  TAKE 1 TABLET BY MOUTH ONCE A DAY     oxyCODONE-acetaminophen 5-325 MG per tablet  Commonly known as:  PERCOCET/ROXICET  TAKE 1 TABLET BY MOUTH EVERY 6HRS AS NEEDED FOR PAIN     sulfamethoxazole-trimethoprim 800-160 MG per tablet  Commonly known as:  BACTRIM DS,SEPTRA DS  Take 1 tablet by mouth every 12 (twelve) hours.     traMADol 50 MG tablet  Commonly known as:  ULTRAM  TAKE 2 TABLETS EVERY 8 HOURS AS NEEDED FOR POST OP PAIN        Allergies: No Known Allergies  Family History: Family History  Problem Relation Age of Onset  . Kidney cancer Neg Hx   . Prostate cancer Neg Hx   . Dementia Mother     Social History:  reports that she quit smoking about 4 years ago. She does not have any smokeless tobacco history on file. She reports that she drinks about 0.6 oz of alcohol per week. She reports that she does not use  illicit drugs.  ROS: UROLOGY Frequent Urination?: Yes Hard to postpone urination?: Yes Burning/pain with urination?: Yes Get up at night to urinate?: Yes Leakage of urine?: Yes Urine stream starts and stops?: No Trouble starting stream?: No Do you have to strain to urinate?: No Blood in urine?: No Urinary tract infection?: No Sexually transmitted disease?: No Injury to kidneys or bladder?: No Painful intercourse?: No Weak stream?: No Currently pregnant?: No Vaginal bleeding?: No Last menstrual period?: n  Gastrointestinal Nausea?: No Vomiting?: No Indigestion/heartburn?: No Diarrhea?: No Constipation?: No  Constitutional Fever: No Night sweats?:  No Weight loss?: No Fatigue?: No  Skin Skin rash/lesions?: No Itching?: No  Eyes Blurred vision?: No Double vision?: No  Ears/Nose/Throat Sore throat?: No Sinus problems?: No  Hematologic/Lymphatic Swollen glands?: No Easy bruising?: No  Cardiovascular Leg swelling?: No Chest pain?: No  Respiratory Cough?: No Shortness of breath?: No  Endocrine Excessive thirst?: No  Musculoskeletal Back pain?: No Joint pain?: No  Neurological Headaches?: No Dizziness?: No  Psychologic Depression?: No Anxiety?: No  Physical Exam: BP 121/74 mmHg  Pulse 84  Ht 5\' 3"  (1.6 m)  Wt 176 lb (79.833 kg)  BMI 31.18 kg/m2  LMP 07/20/2004  GU:  Atrophic external genitalia.  Normal urethral meatus. No urethral masses and/or tenderness. Urethral hypermobility noted on exam.  No leakage was identified.  No bladder fullness or masses. No vaginal lesions or discharge. Normal rectal tone, no masses. Normal anus and perineum.   Laboratory Data: Results for orders placed or performed in visit on 10/27/14  Microscopic Examination  Result Value Ref Range   WBC, UA >30 (H) 0 -  5 /hpf   RBC, UA 3-10 (A) 0 -  2 /hpf   Epithelial Cells (non renal) 0-10 0 - 10 /hpf   Casts Present (A) None seen /lpf   Cast Type Hyaline casts N/A   Mucus, UA Present (A) Not Estab.   Bacteria, UA None seen None seen/Few  Urinalysis, Complete  Result Value Ref Range   Specific Gravity, UA 1.015 1.005 - 1.030   pH, UA 6.0 5.0 - 7.5   Color, UA Green (A) Yellow   Appearance Ur Cloudy (A) Clear   Leukocytes, UA 1+ (A) Negative   Protein, UA 1+ (A) Negative/Trace   Glucose, UA Negative Negative   Ketones, UA Negative Negative   RBC, UA 1+ (A) Negative   Bilirubin, UA Positive (A) Negative   Urobilinogen, Ur 0.2 0.2 - 1.0 mg/dL   Nitrite, UA Negative Negative   Microscopic Examination See below:   BLADDER SCAN AMB NON-IMAGING  Result Value Ref Range   Scan Result 0    Urinalysis    Component Value  Date/Time   GLUCOSEU Negative 10/06/2014 1409   BILIRUBINUR Negative 10/06/2014 1409   NITRITE Negative 10/06/2014 1409   LEUKOCYTESUR 1+* 10/06/2014 1409    Pertinent Imaging:   Assessment & Plan:    1. Dysuria:   Patient has a history of prior urinary tract infections. Her UA today is suspicious for infection. I will send for culture. I will wait to start antibiotics until urine culture and sensitivities are returned.   - Urinalysis, Complete  2. Urinary frequency:   Patient has had a several month history of urinary frequency and urge incontinence.  She had no PVR today's exam. I discussed with the patient avoiding dietary irritants, such as caffeine, physical therapy, time voiding and medicinal therapy. She would like to try an overactive bladder agent. I have given her  Myrbetriq 50 mg samples (#28).  I have advised the patient of the side effects of Myrbetriq, such as: elevation in BP, urinary retention and/or HA.  She will return in 3 weeks for symptom recheck and PVR.  - BLADDER SCAN AMB NON-IMAGING  3. Atrophic vaginitis:   Patient was given a sample of vaginal estrogen cream (PREMARIN) and instructed to apply 0.5mg  (pea-sized amount)  just inside the vaginal introitus with a finger-tip every night for two weeks and then Monday, Wednesday and Friday nights.  I explained to the patient that vaginally administered estrogen, which causes only a slight increase in the blood estrogen levels, have fewer contraindications and adverse systemic effects that oral HT.  We will reexamine her vaginal mucosa when she returns in 3 weeks.   No Follow-up on file.  Zara Council, [facility] [address] [place], [place] [address] [phone]

## 2014-10-29 ENCOUNTER — Telehealth: Payer: Self-pay

## 2014-10-29 LAB — CULTURE, URINE COMPREHENSIVE

## 2014-10-29 NOTE — Telephone Encounter (Signed)
Spoke with pt and made aware of negative ucx. Pt voiced understanding.

## 2014-10-29 NOTE — Telephone Encounter (Signed)
-----   Message from Nori Riis, PA-C sent at 10/29/2014 11:40 AM EDT ----- Patient's culture is negative.

## 2014-11-02 ENCOUNTER — Telehealth: Payer: Self-pay | Admitting: Urology

## 2014-11-02 NOTE — Telephone Encounter (Signed)
Attempted to contact the pt, no answer. LMOM. 

## 2014-11-02 NOTE — Telephone Encounter (Signed)
-----   Message from Nori Riis, PA-C sent at 10/29/2014  4:13 PM EDT ----- Urine culture is negative.

## 2014-11-03 NOTE — Telephone Encounter (Signed)
-----   Message from Nori Riis, PA-C sent at 10/29/2014  4:13 PM EDT ----- Urine culture is negative.

## 2014-11-03 NOTE — Telephone Encounter (Signed)
Pt.notified

## 2014-11-19 ENCOUNTER — Ambulatory Visit (INDEPENDENT_AMBULATORY_CARE_PROVIDER_SITE_OTHER): Payer: PPO | Admitting: Urology

## 2014-11-19 ENCOUNTER — Encounter: Payer: Self-pay | Admitting: Urology

## 2014-11-19 VITALS — BP 126/75 | HR 83 | Ht 63.0 in | Wt 178.9 lb

## 2014-11-19 DIAGNOSIS — R300 Dysuria: Secondary | ICD-10-CM | POA: Diagnosis not present

## 2014-11-19 DIAGNOSIS — N952 Postmenopausal atrophic vaginitis: Secondary | ICD-10-CM

## 2014-11-19 DIAGNOSIS — R350 Frequency of micturition: Secondary | ICD-10-CM | POA: Diagnosis not present

## 2014-11-19 LAB — BLADDER SCAN AMB NON-IMAGING: Scan Result: 63

## 2014-11-19 MED ORDER — MIRABEGRON ER 50 MG PO TB24: 50.0000 mg | ORAL_TABLET | Freq: Every day | ORAL | Status: AC

## 2014-11-19 MED ORDER — ESTROGENS, CONJUGATED 0.625 MG/GM VA CREA: TOPICAL_CREAM | VAGINAL | Status: DC

## 2014-11-19 NOTE — Progress Notes (Signed)
11/19/2014 8:12 AM   Barbara Lewis [birth year] [id number]  Referring provider: Dion Body, MD [place],  [address]  Chief Complaint  Patient presents with  . Urinary Frequency    follow up  . atrophic vaginitis    HPI: Patient is a 64 year old African American female with atrophic vaginitis and urge incontinence who presents today for follow up after being placed on Myrbetriq 50 mg and Premarin cream three weeks ago.    She has noticed a great reduction in her urge incontinence with the Myrbetriq.  She is not experiencing any untoward side effects with the medication.  Her PVR today is 63 mL.  She denies dysuria, gross hematuria and suprapubic pain. She also denies fevers, chills, nausea and vomiting.   PMH: Past Medical History  Diagnosis Date  . Hypertension   . Hematuria, microscopic   . Urinary incontinence   . Urinary frequency   . Anemia     Surgical History: Past Surgical History  Procedure Laterality Date  . Cystoscopy with biopsy N/A 09/13/2014    Procedure: CYSTOSCOPY WITH BIOPSY;  Surgeon: Collier Flowers, MD;  Location: [facility] ORS;  Service: Urology;  Laterality: N/A;    Home Medications:    Medication List       This list is accurate as of: 11/19/14 11:59 PM.  Always use your most recent med list.               conjugated estrogens vaginal cream  Commonly known as:  PREMARIN  apply 0.5mg  (pea-sized amount)  just inside the vaginal introitus with a finger-tip every night for two weeks and then Monday, Wednesday and Friday nights.     lisinopril-hydrochlorothiazide 10-12.5 MG per tablet  Commonly known as:  PRINZIDE,ZESTORETIC  TAKE 1 TABLET BY MOUTH ONCE A DAY     mirabegron ER 50 MG Tb24 tablet  Commonly known as:  MYRBETRIQ  Take 1 tablet (50 mg total) by mouth daily.     oxyCODONE-acetaminophen 5-325 MG per tablet  Commonly known as:  PERCOCET/ROXICET  TAKE 1 TABLET BY MOUTH EVERY 6HRS AS NEEDED FOR PAIN     sulfamethoxazole-trimethoprim 800-160 MG per tablet  Commonly known as:  BACTRIM DS,SEPTRA DS  Take 1 tablet by mouth every 12 (twelve) hours.     traMADol 50 MG tablet  Commonly known as:  ULTRAM  TAKE 2 TABLETS EVERY 8 HOURS AS NEEDED FOR POST OP PAIN        Allergies: No Known Allergies  Family History: Family History  Problem Relation Age of Onset  . Kidney cancer Neg Hx   . Prostate cancer Neg Hx   . Dementia Mother     Social History:  reports that she quit smoking about 4 years ago. She does not have any smokeless tobacco history on file. She reports that she drinks about 0.6 oz of alcohol per week. She reports that she does not use illicit drugs.  ROS: UROLOGY Frequent Urination?: No Hard to postpone urination?: No Burning/pain with urination?: No Get up at night to urinate?: No Leakage of urine?: No Urine stream starts and stops?: No Trouble starting stream?: No Do you have to strain to urinate?: No Blood in urine?: No Urinary tract infection?: No Sexually transmitted disease?: No Injury to kidneys or bladder?: No Painful intercourse?: No Weak stream?: No Currently pregnant?: No Vaginal bleeding?: No Last menstrual period?: n  Gastrointestinal Nausea?: No Vomiting?: No Indigestion/heartburn?: No Diarrhea?: No Constipation?: No  Constitutional Fever: No Night sweats?: No Weight loss?: No Fatigue?: No  Skin Skin rash/lesions?: No Itching?: No  Eyes Blurred vision?: No Double vision?: No  Ears/Nose/Throat Sore throat?: No Sinus problems?: No  Hematologic/Lymphatic Swollen glands?: No Easy bruising?: No  Cardiovascular Leg swelling?: No Chest pain?: No  Respiratory Cough?: No Shortness of breath?: No  Endocrine Excessive thirst?: No  Musculoskeletal Back pain?: No Joint pain?: No  Neurological Headaches?: No Dizziness?: No  Psychologic Depression?: No Anxiety?: No  Physical Exam: BP 126/75 mmHg  Pulse 83  Ht 5\' 3"   (1.6 m)  Wt 178 lb 14.4 oz (81.149 kg)  BMI 31.70 kg/m2  LMP 07/20/2004  GU: Atrophic external genitalia. Normal urethral meatus. No urethral masses and/or tenderness. Urethral hypermobility noted on exam. No leakage was identified. No bladder fullness or masses. No vaginal lesions or discharge. Normal rectal tone, no masses. Normal anus and perineum.    Pertinent Imaging: Results for KAYLAMARIE, SWICKARD (MRN [id number]) as of 11/19/2014 09:54  Ref. Range 11/19/2014 09:37  Scan Result Unknown 63    Assessment & Plan:    1. Dysuria: Urine culture was negative from the 10/27/2014.  Dysuria has abated.     2. Urinary frequency: Patient has good success with Myrbetriq 50 mg daily.  She will like to continue this medications.  I have given her more samples (#21) and a coupon for one free month.  I have sent a prescription into her pharmacy.  She will return in one year for symptom recheck and PVR.  She is to contact the office if she has difficulty obtaining the medication.     - BLADDER SCAN AMB NON-IMAGING  3. Atrophic vaginitis: Patient was given a sample of vaginal estrogen cream (PREMARIN) and instructed to apply 0.5mg  (pea-sized amount) just inside the vaginal introitus with a finger-tip every  Monday, Wednesday and Friday nights. I have sent a prescription into her pharmacy.  She is to contact the office if she has difficulty obtaining the medication.    Return in about 1 year (around 11/19/2015) for PVR and exam.  Barbara Lewis, [facility]  [facility] [address] [place], [place] [address] [phone]

## 2015-02-04 ENCOUNTER — Other Ambulatory Visit: Payer: PPO | Admitting: Urology

## 2015-02-11 ENCOUNTER — Ambulatory Visit (INDEPENDENT_AMBULATORY_CARE_PROVIDER_SITE_OTHER): Payer: PPO | Admitting: Urology

## 2015-02-11 ENCOUNTER — Encounter: Payer: Self-pay | Admitting: Urology

## 2015-02-11 VITALS — BP 151/87 | HR 68 | Ht 63.0 in | Wt 189.7 lb

## 2015-02-11 DIAGNOSIS — N952 Postmenopausal atrophic vaginitis: Secondary | ICD-10-CM | POA: Diagnosis not present

## 2015-02-11 DIAGNOSIS — R350 Frequency of micturition: Secondary | ICD-10-CM | POA: Diagnosis not present

## 2015-02-11 DIAGNOSIS — R3129 Other microscopic hematuria: Secondary | ICD-10-CM | POA: Diagnosis not present

## 2015-02-11 LAB — URINALYSIS, COMPLETE
Bilirubin, UA: NEGATIVE
GLUCOSE, UA: NEGATIVE
Ketones, UA: NEGATIVE
LEUKOCYTES UA: NEGATIVE
Nitrite, UA: NEGATIVE
Protein, UA: NEGATIVE
Specific Gravity, UA: 1.025 (ref 1.005–1.030)
Urobilinogen, Ur: 0.2 mg/dL (ref 0.2–1.0)
pH, UA: 5 (ref 5.0–7.5)

## 2015-02-11 LAB — MICROSCOPIC EXAMINATION

## 2015-02-11 NOTE — Progress Notes (Signed)
9:31 PM   Barbara Lewis 04/15/1950 [id number]  Referring provider: Dion Body, MD [facility] [place], [place] [address]  Chief Complaint  Patient presents with  . Urinary Frequency    follow up    HPI: Patient is a 64 year old Latin American female who underwent a hematuria workup this year with a CT urogram on 07/21/2014, a cystoscopy on 07/28/2014 for which 3 tumors were identified on the posterior wall and a TURBT on 09/13/2014 which noted chronic cystitis without malignancy.  She was scheduled to undergo a repeat cystoscopy with Dr. Elnoria Howard to ensure these areas within the bladder had healed, but that appointment was canceled.  She states her urinary frequency, urgency incontinence and vaginal discomfort have abated.  She is no longer taking Myrbetriq 50 mg daily or using the Premarin cream.  She denies dysuria, gross hematuria and suprapubic pain. She also denies fevers, chills, nausea and vomiting.   PMH: Past Medical History  Diagnosis Date  . Hypertension   . Hematuria, microscopic   . Urinary incontinence   . Urinary frequency   . Anemia     Surgical History: Past Surgical History  Procedure Laterality Date  . Cystoscopy with biopsy N/A 09/13/2014    Procedure: CYSTOSCOPY WITH BIOPSY;  Surgeon: Collier Flowers, MD;  Location: [facility] ORS;  Service: Urology;  Laterality: N/A;    Home Medications:    Medication List       This list is accurate as of: 02/11/15 11:59 PM.  Always use your most recent med list.               conjugated estrogens vaginal cream  Commonly known as:  PREMARIN  apply 0.5mg  (pea-sized amount)  just inside the vaginal introitus with a finger-tip every night for two weeks and then Monday, Wednesday and Friday nights.     lisinopril-hydrochlorothiazide 10-12.5 MG tablet  Commonly known as:  PRINZIDE,ZESTORETIC  TAKE 1 TABLET BY MOUTH ONCE A DAY     mirabegron ER 50 MG Tb24 tablet  Commonly known as:   MYRBETRIQ  Take 1 tablet (50 mg total) by mouth daily.        Allergies: No Known Allergies  Family History: Family History  Problem Relation Age of Onset  . Kidney cancer Neg Hx   . Prostate cancer Neg Hx   . Dementia Mother     Social History:  reports that she quit smoking about 4 years ago. She does not have any smokeless tobacco history on file. She reports that she drinks about 0.6 oz of alcohol per week. She reports that she does not use illicit drugs.  ROS: UROLOGY Frequent Urination?: No Hard to postpone urination?: No Burning/pain with urination?: No Get up at night to urinate?: No Leakage of urine?: No Urine stream starts and stops?: No Trouble starting stream?: No Do you have to strain to urinate?: No Blood in urine?: No Urinary tract infection?: No Sexually transmitted disease?: No Injury to kidneys or bladder?: No Painful intercourse?: No Weak stream?: No Currently pregnant?: No Vaginal bleeding?: No Last menstrual period?: n  Gastrointestinal Nausea?: No Vomiting?: No Indigestion/heartburn?: No Diarrhea?: No Constipation?: No  Constitutional Fever: No Night sweats?: No Weight loss?: No Fatigue?: No  Skin Skin rash/lesions?: No Itching?: No  Eyes Blurred vision?: No Double vision?: No  Ears/Nose/Throat Sore throat?: No Sinus problems?: No  Hematologic/Lymphatic Swollen glands?: No Easy bruising?: No  Cardiovascular Leg swelling?: No Chest pain?: No  Respiratory  Cough?: No Shortness of breath?: No  Endocrine Excessive thirst?: No  Musculoskeletal Back pain?: No Joint pain?: No  Neurological Headaches?: No Dizziness?: No  Psychologic Depression?: No Anxiety?: No  Physical Exam: BP 151/87 mmHg  Pulse 68  Ht 5\' 3"  (1.6 m)  Wt 189 lb 11.2 oz (86.047 kg)  BMI 33.61 kg/m2  LMP 07/20/2004  GU: Atrophic external genitalia. Normal urethral meatus. No urethral masses and/or tenderness. Urethral hypermobility noted  on exam. No leakage was identified. No bladder fullness or masses. No vaginal lesions or discharge. Normal rectal tone, no masses. Normal anus and perineum.    Assessment & Plan:    1. Microscopic hematuria:    Patient's UA today noted 3-10 RBCs/hpf.   She will need to have her follow-up cystoscopy rescheduled.  If cystoscopy is benign, I have offered her referral to nephrology.  2. Urinary frequency: Patient is no longer experiencing her urinary frequency or urgency and has discontinued the Myrbetriq.    3. Atrophic vaginitis: Patient is no longer applying the vaginal estrogen cream.  Return for schedule cystoscopy.  Zara Council, [facility] [address], [place], [place] [address] [phone]

## 2015-02-15 ENCOUNTER — Telehealth: Payer: Self-pay | Admitting: Urology

## 2015-02-15 DIAGNOSIS — R3129 Other microscopic hematuria: Secondary | ICD-10-CM | POA: Insufficient documentation

## 2015-02-15 NOTE — Telephone Encounter (Signed)
Patient was to undergo a cystoscopy on 02/04/2015 to make sure that the areas that were biopsied in her bladder were healed.  This appointment was cancelled and she was put on my schedule.  She is still have microscopic hematuria, so I would advise her to have the cystoscopy.

## 2015-02-16 NOTE — Telephone Encounter (Signed)
Can you change this back to a cysto appt?

## 2015-02-22 NOTE — Telephone Encounter (Signed)
LM for patient to cb to schd her cysto

## 2015-02-24 NOTE — Telephone Encounter (Signed)
LM for patient to CB to schedule the cysto   Sharyn Lull

## 2015-03-04 ENCOUNTER — Encounter: Payer: Self-pay | Admitting: Urology

## 2015-03-04 NOTE — Telephone Encounter (Signed)
Mailed the patient a certified letter today to call and schedule an appointment  Barbara Lewis

## 2015-04-08 ENCOUNTER — Other Ambulatory Visit: Payer: Self-pay | Admitting: Family Medicine

## 2015-04-08 DIAGNOSIS — Z0000 Encounter for general adult medical examination without abnormal findings: Secondary | ICD-10-CM | POA: Diagnosis not present

## 2015-04-08 DIAGNOSIS — Z1231 Encounter for screening mammogram for malignant neoplasm of breast: Secondary | ICD-10-CM

## 2015-04-11 DIAGNOSIS — Z0000 Encounter for general adult medical examination without abnormal findings: Secondary | ICD-10-CM | POA: Diagnosis not present

## 2015-04-14 DIAGNOSIS — H40053 Ocular hypertension, bilateral: Secondary | ICD-10-CM | POA: Diagnosis not present

## 2015-04-21 ENCOUNTER — Ambulatory Visit
Admission: RE | Admit: 2015-04-21 | Discharge: 2015-04-21 | Disposition: A | Discharge status: Home / Self Care | Payer: PPO | Source: Ambulatory Visit | Attending: Family Medicine | Admitting: Family Medicine

## 2015-04-21 ENCOUNTER — Ambulatory Visit: Payer: PPO

## 2015-04-21 DIAGNOSIS — Z1231 Encounter for screening mammogram for malignant neoplasm of breast: Secondary | ICD-10-CM | POA: Diagnosis not present

## 2015-05-06 DIAGNOSIS — Z1211 Encounter for screening for malignant neoplasm of colon: Secondary | ICD-10-CM | POA: Diagnosis not present

## 2015-06-14 ENCOUNTER — Ambulatory Visit: Admission: RE | Admit: 2015-06-14 | Payer: PPO | Source: Ambulatory Visit | Admitting: Gastroenterology

## 2015-06-14 ENCOUNTER — Encounter: Admission: RE | Payer: Self-pay | Source: Ambulatory Visit

## 2015-06-14 SURGERY — COLONOSCOPY WITH PROPOFOL
Anesthesia: General

## 2015-07-14 ENCOUNTER — Encounter: Payer: Self-pay | Admitting: *Deleted

## 2015-07-15 ENCOUNTER — Encounter: Payer: Self-pay | Admitting: Anesthesiology

## 2015-07-15 ENCOUNTER — Ambulatory Visit
Admission: RE | Admit: 2015-07-15 | Discharge: 2015-07-15 | Disposition: A | Discharge status: Home / Self Care | Payer: PPO | Source: Ambulatory Visit | Attending: Gastroenterology | Admitting: Gastroenterology

## 2015-07-15 ENCOUNTER — Encounter
Admission: RE | Disposition: A | Discharge status: Home / Self Care | Payer: Self-pay | Source: Ambulatory Visit | Attending: Gastroenterology

## 2015-07-15 ENCOUNTER — Ambulatory Visit: Payer: PPO | Admitting: Anesthesiology

## 2015-07-15 DIAGNOSIS — K635 Polyp of colon: Secondary | ICD-10-CM | POA: Insufficient documentation

## 2015-07-15 DIAGNOSIS — K648 Other hemorrhoids: Secondary | ICD-10-CM | POA: Diagnosis not present

## 2015-07-15 DIAGNOSIS — D123 Benign neoplasm of transverse colon: Secondary | ICD-10-CM | POA: Insufficient documentation

## 2015-07-15 DIAGNOSIS — Z87891 Personal history of nicotine dependence: Secondary | ICD-10-CM | POA: Insufficient documentation

## 2015-07-15 DIAGNOSIS — I10 Essential (primary) hypertension: Secondary | ICD-10-CM | POA: Insufficient documentation

## 2015-07-15 DIAGNOSIS — D124 Benign neoplasm of descending colon: Secondary | ICD-10-CM | POA: Diagnosis not present

## 2015-07-15 DIAGNOSIS — K640 First degree hemorrhoids: Secondary | ICD-10-CM | POA: Diagnosis not present

## 2015-07-15 DIAGNOSIS — Z1211 Encounter for screening for malignant neoplasm of colon: Secondary | ICD-10-CM | POA: Diagnosis not present

## 2015-07-15 DIAGNOSIS — K5730 Diverticulosis of large intestine without perforation or abscess without bleeding: Secondary | ICD-10-CM | POA: Diagnosis not present

## 2015-07-15 HISTORY — PX: COLONOSCOPY WITH PROPOFOL: SHX5780

## 2015-07-15 SURGERY — COLONOSCOPY WITH PROPOFOL
Anesthesia: General

## 2015-07-15 MED ORDER — SODIUM CHLORIDE 0.9 % IV SOLN: INTRAVENOUS | Status: DC

## 2015-07-15 MED ORDER — PROPOFOL 10 MG/ML IV BOLUS
INTRAVENOUS | Status: DC | PRN
  Administered 2015-07-15: 50 mg via INTRAVENOUS

## 2015-07-15 MED ORDER — LIDOCAINE HCL (PF) 1 % IJ SOLN
INTRAMUSCULAR | Status: AC
Start: 2015-07-15 — End: 2015-07-15
  Administered 2015-07-15: 0.03 mL via INTRADERMAL
  Filled 2015-07-15: qty 2

## 2015-07-15 MED ORDER — PROPOFOL 500 MG/50ML IV EMUL
INTRAVENOUS | Status: DC | PRN
  Administered 2015-07-15: 100 ug/kg/min via INTRAVENOUS

## 2015-07-15 MED ORDER — SODIUM CHLORIDE 0.9 % IV SOLN
INTRAVENOUS | Status: DC
  Administered 2015-07-15: 13:00:00 via INTRAVENOUS
  Administered 2015-07-15: 1000 mL via INTRAVENOUS

## 2015-07-15 MED ORDER — LIDOCAINE HCL (PF) 1 % IJ SOLN
2.0000 mL | Freq: Once | INTRAMUSCULAR | Status: AC
  Administered 2015-07-15: 0.03 mL via INTRADERMAL

## 2015-07-15 MED ORDER — MIDAZOLAM HCL 2 MG/2ML IJ SOLN
INTRAMUSCULAR | Status: DC | PRN
Start: 2015-07-15 — End: 2015-07-15
  Administered 2015-07-15: 1 mg via INTRAVENOUS

## 2015-07-15 MED ORDER — PHENYLEPHRINE HCL 10 MG/ML IJ SOLN
INTRAMUSCULAR | Status: DC | PRN
  Administered 2015-07-15 (×2): 100 ug via INTRAVENOUS

## 2015-07-15 MED ORDER — FENTANYL CITRATE (PF) 100 MCG/2ML IJ SOLN
INTRAMUSCULAR | Status: DC | PRN
  Administered 2015-07-15: 50 ug via INTRAVENOUS

## 2015-07-15 NOTE — Transfer of Care (Signed)
Immediate Anesthesia Transfer of Care Note  Patient: Barbara Lewis  Procedure(s) Performed: Procedure(s): COLONOSCOPY WITH PROPOFOL (N/A)  Patient Location: PACU  Anesthesia Type:General  Level of Consciousness: sedated  Airway & Oxygen Therapy: Patient Spontanous Breathing and Patient connected to nasal cannula oxygen  Post-op Assessment: Report given to RN and Post -op Vital signs reviewed and stable  Post vital signs: Reviewed and stable  Last Vitals:  Filed Vitals:   07/15/15 1153  BP: 121/77  Pulse: 84  Temp: 36.4 C  Resp: 17    Last Pain: There were no vitals filed for this visit.       Complications: No apparent anesthesia complications

## 2015-07-15 NOTE — H&P (Signed)
Outpatient short stay form Pre-procedure 07/15/2015 12:39 PM Lollie Sails MD  Primary Physician: Dr. Dion Body  Reason for visit:  Screening colonoscopy  History of present illness:  Patient is a 65 year old female presenting today for colonoscopy. She's never had a colonoscopy before. She tolerated her prep well. She takes no aspirin or blood thinning products.    Current facility-administered medications:  .  0.9 %  sodium chloride infusion, , Intravenous, Continuous, Lollie Sails, MD, Last Rate: 20 mL/hr at 07/15/15 1211, 1,000 mL at 07/15/15 1211 .  0.9 %  sodium chloride infusion, , Intravenous, Continuous, Lollie Sails, MD  Prescriptions prior to admission  Medication Sig Dispense Refill Last Dose  . conjugated estrogens (PREMARIN) vaginal cream apply 0.5mg  (pea-sized amount)  just inside the vaginal introitus with a finger-tip every night for two weeks and then Monday, Wednesday and Friday nights. (Patient not taking: Reported on 02/11/2015) 30 g 12 Not Taking  . lisinopril-hydrochlorothiazide (PRINZIDE,ZESTORETIC) 10-12.5 MG per tablet TAKE 1 TABLET BY MOUTH ONCE A DAY   07/15/2015 at 0900  . mirabegron ER (MYRBETRIQ) 50 MG TB24 tablet Take 1 tablet (50 mg total) by mouth daily. (Patient not taking: Reported on 02/11/2015) 30 tablet 12 Not Taking     No Known Allergies   Past Medical History  Diagnosis Date  . Hypertension   . Hematuria, microscopic   . Urinary incontinence   . Urinary frequency   . Anemia     Review of systems:      Physical Exam    Heart and lungs: Regular rate and rhythm without rub or gallop, lungs are bilaterally clear.    HEENT: Normocephalic atraumatic eyes are anicteric    Other:     Pertinant exam for procedure: Soft nontender nondistended bowel sounds positive normoactive.    Planned proceedures: Colonoscopy and indicated procedures. I have discussed the risks benefits and complications of procedures to include  not limited to bleeding, infection, perforation and the risk of sedation and the patient wishes to proceed.    Lollie Sails, MD Gastroenterology 07/15/2015  12:39 PM

## 2015-07-15 NOTE — Anesthesia Preprocedure Evaluation (Signed)
Anesthesia Evaluation  Patient identified by MRN, date of birth, ID band Patient awake    Reviewed: Allergy & Precautions, H&P , NPO status , Patient's Chart, lab work & pertinent test results, reviewed documented beta blocker date and time   History of Anesthesia Complications Negative for: history of anesthetic complications  Airway Mallampati: III  TM Distance: >3 FB Neck ROM: full    Dental no notable dental hx. (+) Chipped   Pulmonary neg pulmonary ROS, former smoker,    Pulmonary exam normal breath sounds clear to auscultation       Cardiovascular Exercise Tolerance: Good hypertension, On Medications (-) angina(-) CAD, (-) Past MI, (-) Cardiac Stents and (-) CABG negative cardio ROS Normal cardiovascular exam(-) dysrhythmias (-) Valvular Problems/Murmurs Rhythm:regular Rate:Normal     Neuro/Psych negative neurological ROS  negative psych ROS   GI/Hepatic negative GI ROS, Neg liver ROS,   Endo/Other  negative endocrine ROS  Renal/GU negative Renal ROS  negative genitourinary   Musculoskeletal   Abdominal   Peds  Hematology  (+) Blood dyscrasia, anemia ,   Anesthesia Other Findings Past Medical History:   Hypertension                                                 Hematuria, microscopic                                       Urinary incontinence                                         Urinary frequency                                            Anemia                                                       Reproductive/Obstetrics negative OB ROS                             Anesthesia Physical Anesthesia Plan  ASA: II  Anesthesia Plan: General   Post-op Pain Management:    Induction:   Airway Management Planned:   Additional Equipment:   Intra-op Plan:   Post-operative Plan:   Informed Consent: I have reviewed the patients History and Physical, chart, labs and  discussed the procedure including the risks, benefits and alternatives for the proposed anesthesia with the patient or authorized representative who has indicated his/her understanding and acceptance.   Dental Advisory Given  Plan Discussed with: Anesthesiologist, CRNA and Surgeon  Anesthesia Plan Comments:         Anesthesia Quick Evaluation

## 2015-07-15 NOTE — Anesthesia Postprocedure Evaluation (Signed)
Anesthesia Post Note  Patient: Barbara Lewis  Procedure(s) Performed: Procedure(s) (LRB): COLONOSCOPY WITH PROPOFOL (N/A)  Patient location during evaluation: Endoscopy Anesthesia Type: General Level of consciousness: awake and alert Pain management: pain level controlled Vital Signs Assessment: post-procedure vital signs reviewed and stable Respiratory status: spontaneous breathing, nonlabored ventilation, respiratory function stable and patient connected to nasal cannula oxygen Cardiovascular status: blood pressure returned to baseline and stable Postop Assessment: no signs of nausea or vomiting Anesthetic complications: no    Last Vitals:  Filed Vitals:   07/15/15 1349 07/15/15 1359  BP: 130/93 111/81  Pulse: 66 57  Temp:    Resp: 16 16    Last Pain: There were no vitals filed for this visit.               Martha Clan

## 2015-07-15 NOTE — Op Note (Signed)
[facility] Patient Name: Barbara Lewis Procedure Date: 07/15/2015 12:34 PM MRN: [id number] Account #: [id number] Date of Birth: [birth year] Admit Type: Outpatient Age: 65 Room: [facility] ENDO ROOM 3 Gender: Female Note Status: Finalized Procedure:            Colonoscopy Indications:          Screening for colorectal malignant neoplasm, This is                        the patient's first colonoscopy Providers:            Lollie Sails, MD Referring MD:         Dion Body (Referring MD) Medicines:            Monitored Anesthesia Care Complications:        No immediate complications. Procedure:            Pre-Anesthesia Assessment:                       - ASA Grade Assessment: II - A patient with mild                        systemic disease.                       After obtaining informed consent, the colonoscope was                        passed under direct vision. Throughout the procedure,                        the patient's blood pressure, pulse, and oxygen                        saturations were monitored continuously. The                        Colonoscope was introduced through the anus and                        advanced to the the cecum, identified by appendiceal                        orifice and ileocecal valve. The colonoscopy was                        performed without difficulty. The patient tolerated the                        procedure well. The quality of the bowel preparation                        was good. Findings:      Multiple small-mouthed diverticula were found in the sigmoid colon,       descending colon, transverse colon, ascending colon and cecum.      A 5 mm polyp was found in the proximal descending colon. The polyp was       sessile. The polyp was removed with a cold snare and cold forcep.       Resection and retrieval were complete. To prevent bleeding after the  polypectomy, one hemostatic clip was  successfully placed. There was no       bleeding at the end of the maneuver.      A 1 mm polyp was found in the proximal descending colon. The polyp was       sessile. The polyp was removed with a cold biopsy forceps. Resection and       retrieval were complete.      A 3 mm polyp was found in the splenic flexure. The polyp was sessile.       The polyp was removed with a cold snare. Resection and retrieval were       complete.      A 2 mm polyp was found in the mid descending colon. The polyp was       sessile. The polyp was removed with a cold biopsy forceps. Resection and       retrieval were complete.      Non-bleeding internal hemorrhoids were found during retroflexion. The       hemorrhoids were small and Grade I (internal hemorrhoids that do not       prolapse).      The digital rectal exam was normal. Impression:           - Diverticulosis in the sigmoid colon, in the                        descending colon, in the transverse colon, in the                        ascending colon and in the cecum.                       - One 5 mm polyp in the proximal descending colon,                        removed with a cold snare. Resected and retrieved. Clip                        was placed.                       - One 1 mm polyp in the proximal descending colon,                        removed with a cold biopsy forceps. Resected and                        retrieved.                       - One 3 mm polyp at the splenic flexure, removed with a                        cold snare. Resected and retrieved.                       - One 2 mm polyp in the mid descending colon, removed                        with a cold biopsy forceps. Resected and retrieved.                       -  Non-bleeding internal hemorrhoids. Recommendation:       - Await pathology results.                       - Telephone GI clinic for pathology results in 1 week.                       - Full liquid diet today, then advance  as tolerated to                        low fiber diet for 4 days. Procedure Code(s):    --- Professional ---                       [id number], Colonoscopy, flexible; with removal of tumor(s),                        polyp(s), or other lesion(s) by snare technique                       45380, 91, Colonoscopy, flexible; with biopsy, single                        or multiple Diagnosis Code(s):    --- Professional ---                       Z12.11, Encounter for screening for malignant neoplasm                        of colon                       K64.0, First degree hemorrhoids                       D12.3, Benign neoplasm of transverse colon (hepatic                        flexure or splenic flexure)                       D12.4, Benign neoplasm of descending colon                       K57.30, Diverticulosis of large intestine without                        perforation or abscess without bleeding CPT copyright 2016 American Medical Association. All rights reserved. The codes documented in this report are preliminary and upon coder review may  be revised to meet current compliance requirements. Lollie Sails, MD 07/15/2015 1:26:58 PM This report has been signed electronically. Number of Addenda: 0 Note Initiated On: 07/15/2015 12:34 PM Scope Withdrawal Time: 0 hours 14 minutes 44 seconds  Total Procedure Duration: 0 hours 34 minutes 9 seconds       [facility]

## 2015-07-19 ENCOUNTER — Encounter: Payer: Self-pay | Admitting: Gastroenterology

## 2015-07-19 LAB — SURGICAL PATHOLOGY

## 2015-10-07 DIAGNOSIS — I10 Essential (primary) hypertension: Secondary | ICD-10-CM | POA: Diagnosis not present

## 2015-11-25 ENCOUNTER — Ambulatory Visit: Payer: PPO | Admitting: Urology

## 2016-03-16 DIAGNOSIS — J069 Acute upper respiratory infection, unspecified: Secondary | ICD-10-CM | POA: Diagnosis not present

## 2016-03-28 DIAGNOSIS — Z0000 Encounter for general adult medical examination without abnormal findings: Secondary | ICD-10-CM | POA: Diagnosis not present

## 2016-04-05 DIAGNOSIS — Z780 Asymptomatic menopausal state: Secondary | ICD-10-CM | POA: Diagnosis not present

## 2016-04-05 DIAGNOSIS — Z0000 Encounter for general adult medical examination without abnormal findings: Secondary | ICD-10-CM | POA: Diagnosis not present

## 2016-04-05 DIAGNOSIS — I10 Essential (primary) hypertension: Secondary | ICD-10-CM | POA: Diagnosis not present

## 2016-04-16 DIAGNOSIS — Z780 Asymptomatic menopausal state: Secondary | ICD-10-CM | POA: Diagnosis not present

## 2016-05-18 DIAGNOSIS — H40053 Ocular hypertension, bilateral: Secondary | ICD-10-CM | POA: Diagnosis not present

## 2016-06-18 ENCOUNTER — Other Ambulatory Visit: Payer: Self-pay | Admitting: Family Medicine

## 2016-06-18 DIAGNOSIS — Z1231 Encounter for screening mammogram for malignant neoplasm of breast: Secondary | ICD-10-CM

## 2016-07-06 ENCOUNTER — Ambulatory Visit
Admission: RE | Admit: 2016-07-06 | Discharge: 2016-07-06 | Disposition: A | Discharge status: Home / Self Care | Payer: PPO | Source: Ambulatory Visit | Attending: Family Medicine | Admitting: Family Medicine

## 2016-07-06 DIAGNOSIS — Z1231 Encounter for screening mammogram for malignant neoplasm of breast: Secondary | ICD-10-CM | POA: Insufficient documentation

## 2016-10-03 DIAGNOSIS — I10 Essential (primary) hypertension: Secondary | ICD-10-CM | POA: Diagnosis not present

## 2016-10-05 DIAGNOSIS — E669 Obesity, unspecified: Secondary | ICD-10-CM | POA: Diagnosis not present

## 2016-10-05 DIAGNOSIS — I10 Essential (primary) hypertension: Secondary | ICD-10-CM | POA: Diagnosis not present

## 2016-11-16 DIAGNOSIS — H40053 Ocular hypertension, bilateral: Secondary | ICD-10-CM | POA: Diagnosis not present

## 2016-11-26 DIAGNOSIS — H401111 Primary open-angle glaucoma, right eye, mild stage: Secondary | ICD-10-CM | POA: Diagnosis not present

## 2017-01-04 DIAGNOSIS — H401111 Primary open-angle glaucoma, right eye, mild stage: Secondary | ICD-10-CM | POA: Diagnosis not present

## 2017-02-05 ENCOUNTER — Other Ambulatory Visit: Payer: Self-pay | Admitting: Pharmacy Technician

## 2017-02-05 NOTE — Patient Outreach (Signed)
[facility] [facility]) Care Management  02/05/2017  Barbara Lewis [birth year] [id number]  Incoming [employer] EMMI call in reference to medication adherence. HIPAA identifiers verified and verbal consent received. Patient states she takes Lisinopril/HTCZ and she occasionally misses a dose but not often. She does not currently have any barriers.  Doreene Burke, [place] [phone]

## 2017-02-08 DIAGNOSIS — H401111 Primary open-angle glaucoma, right eye, mild stage: Secondary | ICD-10-CM | POA: Diagnosis not present

## 2017-03-15 DIAGNOSIS — H401111 Primary open-angle glaucoma, right eye, mild stage: Secondary | ICD-10-CM | POA: Diagnosis not present

## 2017-04-05 DIAGNOSIS — I10 Essential (primary) hypertension: Secondary | ICD-10-CM | POA: Diagnosis not present

## 2017-04-12 DIAGNOSIS — I10 Essential (primary) hypertension: Secondary | ICD-10-CM | POA: Diagnosis not present

## 2017-04-12 DIAGNOSIS — E669 Obesity, unspecified: Secondary | ICD-10-CM | POA: Diagnosis not present

## 2017-04-12 DIAGNOSIS — Z0000 Encounter for general adult medical examination without abnormal findings: Secondary | ICD-10-CM | POA: Diagnosis not present

## 2017-05-28 ENCOUNTER — Other Ambulatory Visit: Payer: Self-pay | Admitting: Family Medicine

## 2017-05-28 DIAGNOSIS — Z1231 Encounter for screening mammogram for malignant neoplasm of breast: Secondary | ICD-10-CM

## 2017-07-12 ENCOUNTER — Ambulatory Visit
Admission: RE | Admit: 2017-07-12 | Discharge: 2017-07-12 | Disposition: A | Discharge status: Home / Self Care | Payer: PPO | Source: Ambulatory Visit | Attending: Family Medicine | Admitting: Family Medicine

## 2017-07-12 DIAGNOSIS — Z1231 Encounter for screening mammogram for malignant neoplasm of breast: Secondary | ICD-10-CM | POA: Insufficient documentation

## 2017-07-15 DIAGNOSIS — H401111 Primary open-angle glaucoma, right eye, mild stage: Secondary | ICD-10-CM | POA: Diagnosis not present

## 2017-10-03 DIAGNOSIS — S80862A Insect bite (nonvenomous), left lower leg, initial encounter: Secondary | ICD-10-CM | POA: Diagnosis not present

## 2017-10-03 DIAGNOSIS — W57XXXA Bitten or stung by nonvenomous insect and other nonvenomous arthropods, initial encounter: Secondary | ICD-10-CM | POA: Diagnosis not present

## 2017-10-04 DIAGNOSIS — I10 Essential (primary) hypertension: Secondary | ICD-10-CM | POA: Diagnosis not present

## 2017-10-11 DIAGNOSIS — I10 Essential (primary) hypertension: Secondary | ICD-10-CM | POA: Diagnosis not present

## 2017-10-11 DIAGNOSIS — Z136 Encounter for screening for cardiovascular disorders: Secondary | ICD-10-CM | POA: Diagnosis not present

## 2017-10-11 DIAGNOSIS — E669 Obesity, unspecified: Secondary | ICD-10-CM | POA: Diagnosis not present

## 2017-10-22 DIAGNOSIS — H1013 Acute atopic conjunctivitis, bilateral: Secondary | ICD-10-CM | POA: Diagnosis not present

## 2017-11-22 DIAGNOSIS — H401111 Primary open-angle glaucoma, right eye, mild stage: Secondary | ICD-10-CM | POA: Diagnosis not present

## 2017-12-13 DIAGNOSIS — H401111 Primary open-angle glaucoma, right eye, mild stage: Secondary | ICD-10-CM | POA: Diagnosis not present

## 2017-12-20 DIAGNOSIS — H401111 Primary open-angle glaucoma, right eye, mild stage: Secondary | ICD-10-CM | POA: Diagnosis not present

## 2018-01-10 DIAGNOSIS — H401111 Primary open-angle glaucoma, right eye, mild stage: Secondary | ICD-10-CM | POA: Diagnosis not present

## 2018-02-13 DIAGNOSIS — H401111 Primary open-angle glaucoma, right eye, mild stage: Secondary | ICD-10-CM | POA: Diagnosis not present

## 2018-03-07 DIAGNOSIS — H40002 Preglaucoma, unspecified, left eye: Secondary | ICD-10-CM | POA: Diagnosis not present

## 2018-04-10 DIAGNOSIS — H401111 Primary open-angle glaucoma, right eye, mild stage: Secondary | ICD-10-CM | POA: Diagnosis not present

## 2018-04-25 DIAGNOSIS — I10 Essential (primary) hypertension: Secondary | ICD-10-CM | POA: Diagnosis not present

## 2018-04-25 DIAGNOSIS — Z136 Encounter for screening for cardiovascular disorders: Secondary | ICD-10-CM | POA: Diagnosis not present

## 2018-05-02 DIAGNOSIS — I10 Essential (primary) hypertension: Secondary | ICD-10-CM | POA: Diagnosis not present

## 2018-05-02 DIAGNOSIS — Z0000 Encounter for general adult medical examination without abnormal findings: Secondary | ICD-10-CM | POA: Diagnosis not present

## 2018-05-02 DIAGNOSIS — E669 Obesity, unspecified: Secondary | ICD-10-CM | POA: Diagnosis not present

## 2018-10-10 DIAGNOSIS — H401111 Primary open-angle glaucoma, right eye, mild stage: Secondary | ICD-10-CM | POA: Diagnosis not present

## 2018-10-17 DIAGNOSIS — H401111 Primary open-angle glaucoma, right eye, mild stage: Secondary | ICD-10-CM | POA: Diagnosis not present

## 2018-10-31 DIAGNOSIS — I10 Essential (primary) hypertension: Secondary | ICD-10-CM | POA: Diagnosis not present

## 2018-11-07 ENCOUNTER — Other Ambulatory Visit: Payer: Self-pay | Admitting: Family Medicine

## 2018-11-07 DIAGNOSIS — E669 Obesity, unspecified: Secondary | ICD-10-CM | POA: Diagnosis not present

## 2018-11-07 DIAGNOSIS — Z136 Encounter for screening for cardiovascular disorders: Secondary | ICD-10-CM | POA: Diagnosis not present

## 2018-11-07 DIAGNOSIS — I10 Essential (primary) hypertension: Secondary | ICD-10-CM | POA: Diagnosis not present

## 2018-11-07 DIAGNOSIS — Z1231 Encounter for screening mammogram for malignant neoplasm of breast: Secondary | ICD-10-CM

## 2018-12-12 ENCOUNTER — Ambulatory Visit
Admission: RE | Admit: 2018-12-12 | Discharge: 2018-12-12 | Disposition: A | Discharge status: Home / Self Care | Payer: PPO | Source: Ambulatory Visit | Attending: Family Medicine | Admitting: Family Medicine

## 2018-12-12 DIAGNOSIS — Z1231 Encounter for screening mammogram for malignant neoplasm of breast: Secondary | ICD-10-CM | POA: Diagnosis not present

## 2019-04-17 DIAGNOSIS — H401111 Primary open-angle glaucoma, right eye, mild stage: Secondary | ICD-10-CM | POA: Diagnosis not present

## 2019-05-01 DIAGNOSIS — Z136 Encounter for screening for cardiovascular disorders: Secondary | ICD-10-CM | POA: Diagnosis not present

## 2019-05-01 DIAGNOSIS — I10 Essential (primary) hypertension: Secondary | ICD-10-CM | POA: Diagnosis not present

## 2019-05-08 DIAGNOSIS — I10 Essential (primary) hypertension: Secondary | ICD-10-CM | POA: Diagnosis not present

## 2019-05-08 DIAGNOSIS — Z0000 Encounter for general adult medical examination without abnormal findings: Secondary | ICD-10-CM | POA: Diagnosis not present

## 2019-05-08 DIAGNOSIS — E669 Obesity, unspecified: Secondary | ICD-10-CM | POA: Diagnosis not present

## 2019-05-09 DIAGNOSIS — Z23 Encounter for immunization: Secondary | ICD-10-CM | POA: Diagnosis not present

## 2019-05-11 ENCOUNTER — Telehealth: Payer: Self-pay | Admitting: *Deleted

## 2019-05-11 NOTE — Telephone Encounter (Signed)
Contacted patient regarding lung screening referral. Patient request to wait until next month to consider lung screening.

## 2019-07-07 ENCOUNTER — Telehealth: Payer: Self-pay | Admitting: *Deleted

## 2019-07-07 NOTE — Telephone Encounter (Signed)
Received referral for low dose lung cancer screening CT scan. Message left at phone number listed in EMR for patient to call me back to facilitate scheduling scan.  

## 2019-10-14 DIAGNOSIS — H401111 Primary open-angle glaucoma, right eye, mild stage: Secondary | ICD-10-CM | POA: Diagnosis not present

## 2019-10-23 DIAGNOSIS — H401131 Primary open-angle glaucoma, bilateral, mild stage: Secondary | ICD-10-CM | POA: Diagnosis not present

## 2019-10-30 ENCOUNTER — Telehealth: Payer: Self-pay

## 2019-10-30 NOTE — Telephone Encounter (Signed)
Contacted patient today to discuss lung CT screening clinic after receiving referral from Dr. Netty Starring.  Message left for patient to call Burgess Estelle, lung navigator.

## 2019-11-05 DIAGNOSIS — I10 Essential (primary) hypertension: Secondary | ICD-10-CM | POA: Diagnosis not present

## 2019-11-12 DIAGNOSIS — E669 Obesity, unspecified: Secondary | ICD-10-CM | POA: Diagnosis not present

## 2019-11-12 DIAGNOSIS — Z136 Encounter for screening for cardiovascular disorders: Secondary | ICD-10-CM | POA: Diagnosis not present

## 2019-11-12 DIAGNOSIS — I10 Essential (primary) hypertension: Secondary | ICD-10-CM | POA: Diagnosis not present

## 2019-12-14 ENCOUNTER — Encounter: Payer: Self-pay | Admitting: *Deleted

## 2020-01-29 ENCOUNTER — Other Ambulatory Visit: Payer: Self-pay | Admitting: Family Medicine

## 2020-01-29 DIAGNOSIS — Z1231 Encounter for screening mammogram for malignant neoplasm of breast: Secondary | ICD-10-CM

## 2020-02-22 DIAGNOSIS — M545 Low back pain, unspecified: Secondary | ICD-10-CM | POA: Diagnosis not present

## 2020-03-11 ENCOUNTER — Other Ambulatory Visit: Payer: Self-pay

## 2020-03-11 ENCOUNTER — Ambulatory Visit
Admission: RE | Admit: 2020-03-11 | Discharge: 2020-03-11 | Disposition: A | Discharge status: Home / Self Care | Payer: PPO | Source: Ambulatory Visit | Attending: Family Medicine | Admitting: Family Medicine

## 2020-03-11 DIAGNOSIS — Z1231 Encounter for screening mammogram for malignant neoplasm of breast: Secondary | ICD-10-CM | POA: Insufficient documentation

## 2020-04-26 DIAGNOSIS — H401131 Primary open-angle glaucoma, bilateral, mild stage: Secondary | ICD-10-CM | POA: Diagnosis not present

## 2020-05-04 DIAGNOSIS — Z136 Encounter for screening for cardiovascular disorders: Secondary | ICD-10-CM | POA: Diagnosis not present

## 2020-05-04 DIAGNOSIS — I10 Essential (primary) hypertension: Secondary | ICD-10-CM | POA: Diagnosis not present

## 2020-05-11 DIAGNOSIS — Z0000 Encounter for general adult medical examination without abnormal findings: Secondary | ICD-10-CM | POA: Diagnosis not present

## 2020-05-11 DIAGNOSIS — E669 Obesity, unspecified: Secondary | ICD-10-CM | POA: Diagnosis not present

## 2020-05-11 DIAGNOSIS — Z86010 Personal history of colonic polyps: Secondary | ICD-10-CM | POA: Diagnosis not present

## 2020-05-11 DIAGNOSIS — I10 Essential (primary) hypertension: Secondary | ICD-10-CM | POA: Diagnosis not present

## 2020-07-20 DIAGNOSIS — Z86010 Personal history of colonic polyps: Secondary | ICD-10-CM | POA: Diagnosis not present

## 2020-07-20 DIAGNOSIS — K219 Gastro-esophageal reflux disease without esophagitis: Secondary | ICD-10-CM | POA: Diagnosis not present

## 2020-09-29 DIAGNOSIS — D122 Benign neoplasm of ascending colon: Secondary | ICD-10-CM | POA: Diagnosis not present

## 2020-09-29 DIAGNOSIS — K5730 Diverticulosis of large intestine without perforation or abscess without bleeding: Secondary | ICD-10-CM | POA: Diagnosis not present

## 2020-09-29 DIAGNOSIS — Z86010 Personal history of colonic polyps: Secondary | ICD-10-CM | POA: Diagnosis not present

## 2020-10-24 DIAGNOSIS — H401131 Primary open-angle glaucoma, bilateral, mild stage: Secondary | ICD-10-CM | POA: Diagnosis not present

## 2020-10-28 DIAGNOSIS — H401131 Primary open-angle glaucoma, bilateral, mild stage: Secondary | ICD-10-CM | POA: Diagnosis not present

## 2020-11-11 DIAGNOSIS — I10 Essential (primary) hypertension: Secondary | ICD-10-CM | POA: Diagnosis not present

## 2020-11-18 DIAGNOSIS — I10 Essential (primary) hypertension: Secondary | ICD-10-CM | POA: Diagnosis not present

## 2020-11-18 DIAGNOSIS — Z136 Encounter for screening for cardiovascular disorders: Secondary | ICD-10-CM | POA: Diagnosis not present

## 2020-11-18 DIAGNOSIS — E669 Obesity, unspecified: Secondary | ICD-10-CM | POA: Diagnosis not present

## 2020-12-26 DIAGNOSIS — H43812 Vitreous degeneration, left eye: Secondary | ICD-10-CM | POA: Diagnosis not present

## 2021-01-26 DIAGNOSIS — H43812 Vitreous degeneration, left eye: Secondary | ICD-10-CM | POA: Diagnosis not present

## 2021-03-02 IMAGING — MG DIGITAL SCREENING BILAT W/ TOMO W/ CAD
8 series · 8 of 24 positions shown · non-contrast
Comparison: Previous exam(s).

CLINICAL DATA: Screening.

EXAM:
DIGITAL SCREENING BILATERAL MAMMOGRAM WITH TOMO AND CAD

[R CC synth-2D]
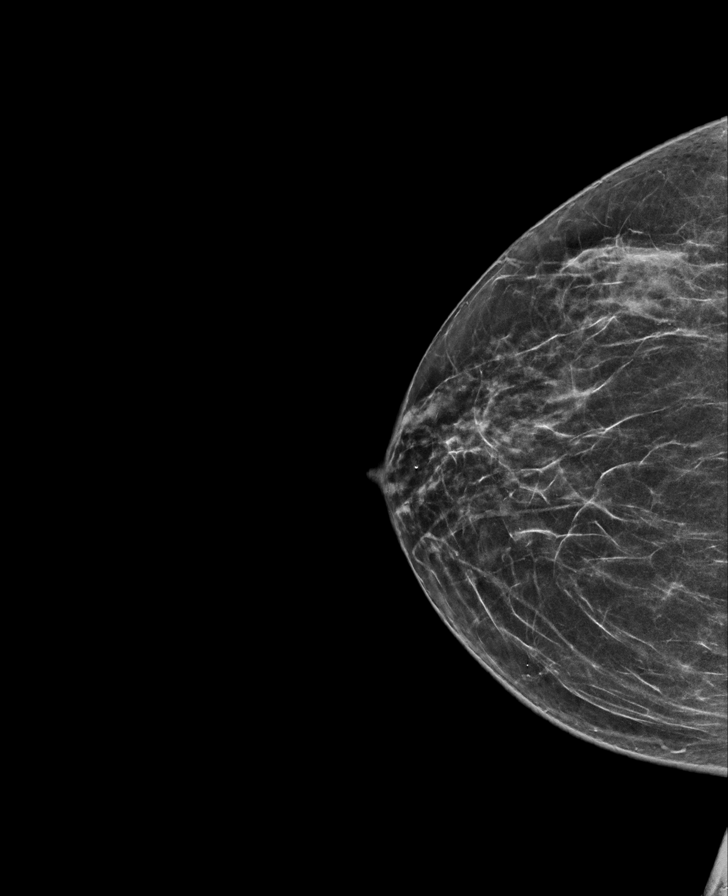

[L MLO synth-2D]
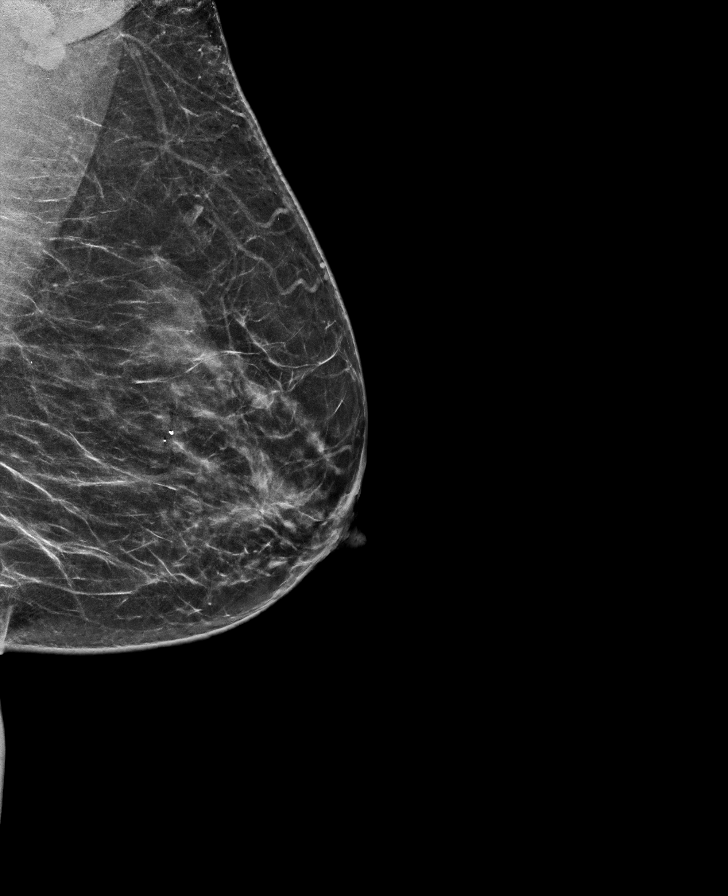

[L CC synth-2D]
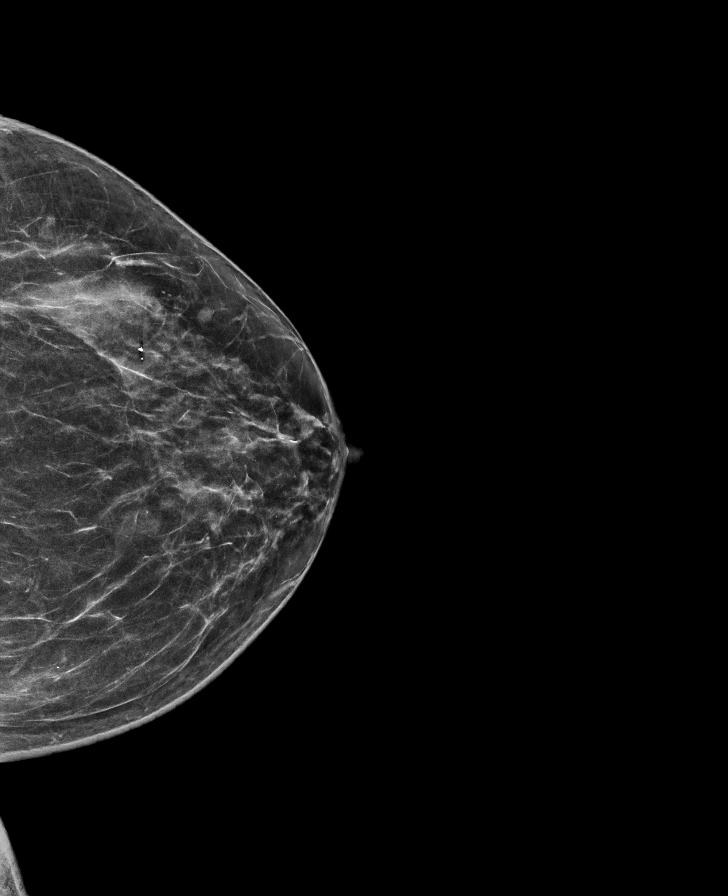

[R MLO synth-2D]
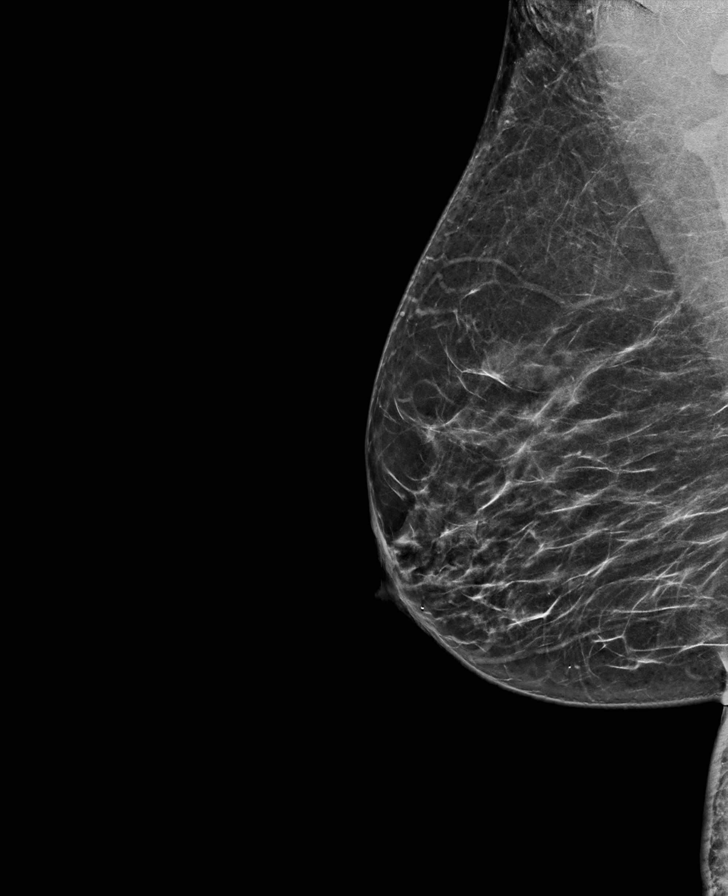

[L CC tomo · tomo slice 30/59.0]
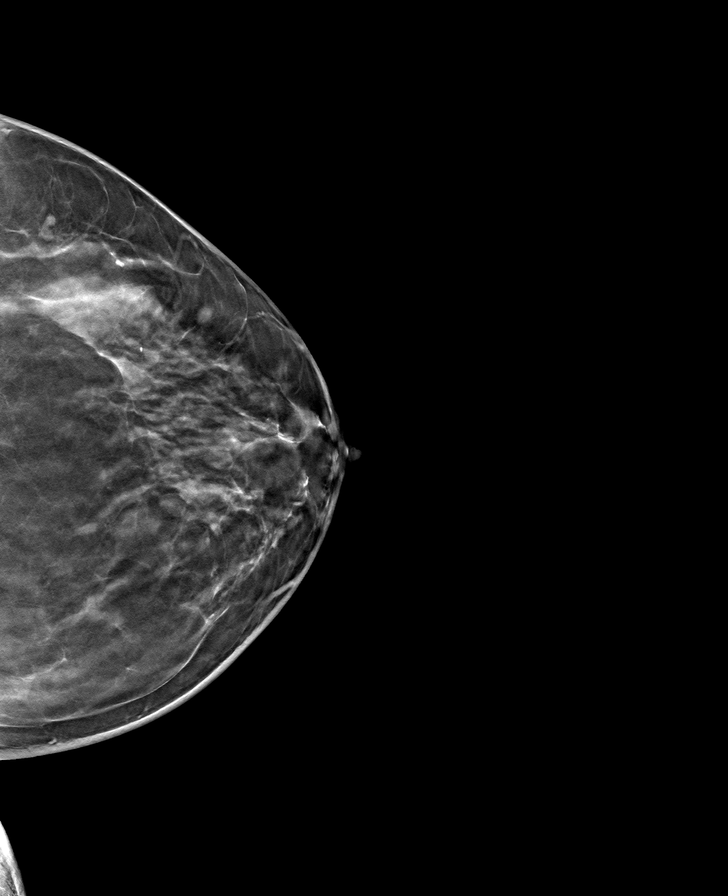

[L MLO tomo · tomo slice 31/62.0]
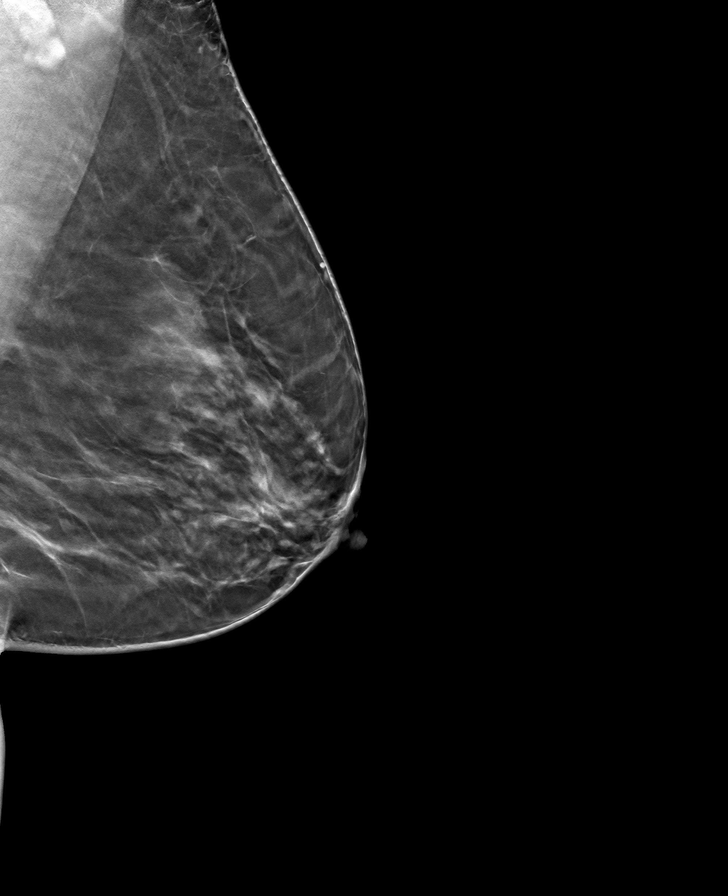

[R MLO tomo · tomo slice 33/66.0]
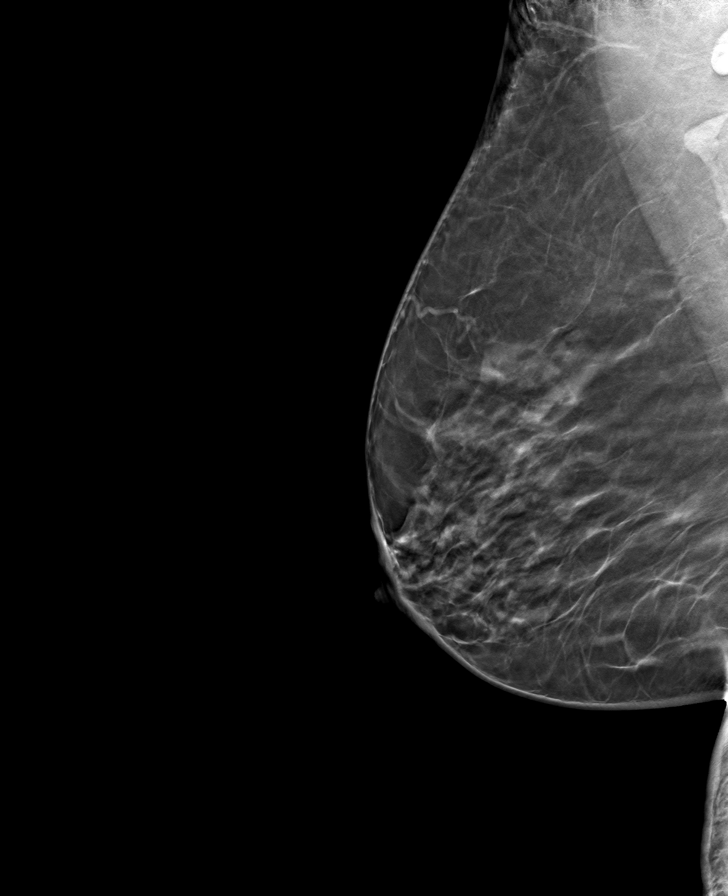

[R CC tomo · tomo slice 29/56.0]
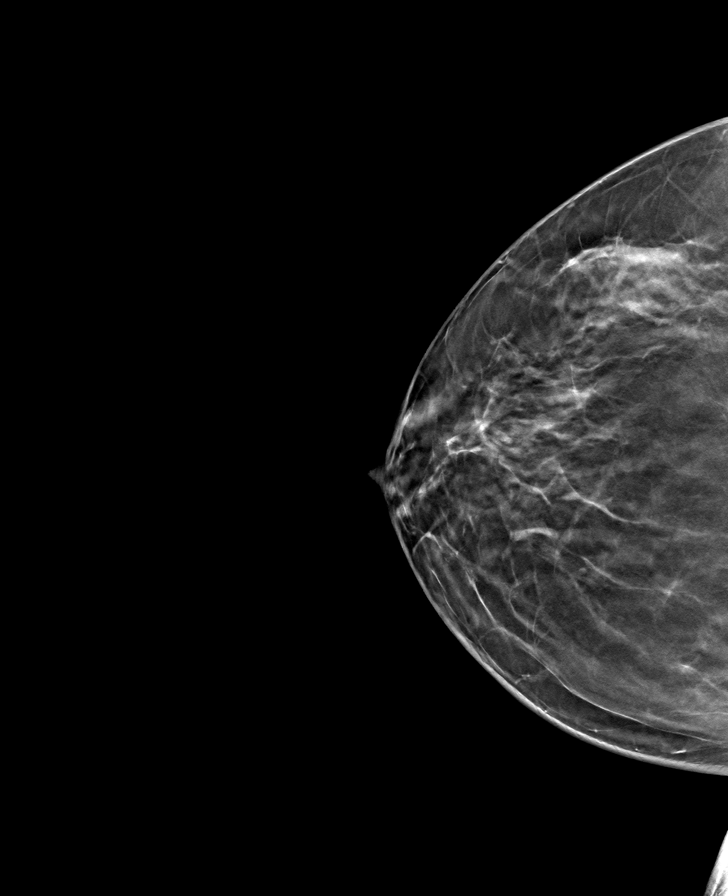

[8 of 24 positions shown; findings below may reference images not displayed]

ACR Breast Density Category c: The breast tissue is heterogeneously
dense, which may obscure small masses.
FINDINGS: There are no findings suspicious for malignancy. Images were
processed with CAD.
IMPRESSION: No mammographic evidence of malignancy. A result letter of this
screening mammogram will be mailed directly to the patient.

RECOMMENDATION:
Screening mammogram in one year. (Code:[id number]-U-LHB)

BI-RADS CATEGORY  1: Negative.

## 2021-03-29 DIAGNOSIS — Z8616 Personal history of COVID-19: Secondary | ICD-10-CM

## 2021-03-29 HISTORY — DX: Personal history of COVID-19: Z86.16

## 2021-04-27 DIAGNOSIS — H401131 Primary open-angle glaucoma, bilateral, mild stage: Secondary | ICD-10-CM | POA: Diagnosis not present

## 2021-05-12 DIAGNOSIS — I10 Essential (primary) hypertension: Secondary | ICD-10-CM | POA: Diagnosis not present

## 2021-05-12 DIAGNOSIS — Z136 Encounter for screening for cardiovascular disorders: Secondary | ICD-10-CM | POA: Diagnosis not present

## 2021-05-19 DIAGNOSIS — E669 Obesity, unspecified: Secondary | ICD-10-CM | POA: Diagnosis not present

## 2021-05-19 DIAGNOSIS — I10 Essential (primary) hypertension: Secondary | ICD-10-CM | POA: Diagnosis not present

## 2021-05-19 DIAGNOSIS — Z0000 Encounter for general adult medical examination without abnormal findings: Secondary | ICD-10-CM | POA: Diagnosis not present

## 2021-06-06 DIAGNOSIS — H2512 Age-related nuclear cataract, left eye: Secondary | ICD-10-CM | POA: Diagnosis not present

## 2021-06-12 ENCOUNTER — Encounter: Payer: Self-pay | Admitting: Ophthalmology

## 2021-06-19 NOTE — Discharge Instructions (Signed)

## 2021-06-21 ENCOUNTER — Encounter: Payer: Self-pay | Admitting: Ophthalmology

## 2021-06-21 ENCOUNTER — Ambulatory Visit
Admission: RE | Admit: 2021-06-21 | Discharge: 2021-06-21 | Disposition: A | Discharge status: Home / Self Care | Payer: PPO | Attending: Ophthalmology | Admitting: Ophthalmology

## 2021-06-21 ENCOUNTER — Encounter
Admission: RE | Disposition: A | Discharge status: Home / Self Care | Payer: Self-pay | Source: Home / Self Care | Attending: Ophthalmology

## 2021-06-21 ENCOUNTER — Ambulatory Visit: Payer: PPO | Admitting: Anesthesiology

## 2021-06-21 ENCOUNTER — Other Ambulatory Visit: Payer: Self-pay

## 2021-06-21 DIAGNOSIS — Z87891 Personal history of nicotine dependence: Secondary | ICD-10-CM | POA: Insufficient documentation

## 2021-06-21 DIAGNOSIS — Z8616 Personal history of COVID-19: Secondary | ICD-10-CM | POA: Insufficient documentation

## 2021-06-21 DIAGNOSIS — H2512 Age-related nuclear cataract, left eye: Secondary | ICD-10-CM | POA: Insufficient documentation

## 2021-06-21 DIAGNOSIS — I10 Essential (primary) hypertension: Secondary | ICD-10-CM | POA: Diagnosis not present

## 2021-06-21 DIAGNOSIS — Z79899 Other long term (current) drug therapy: Secondary | ICD-10-CM | POA: Diagnosis not present

## 2021-06-21 DIAGNOSIS — H25812 Combined forms of age-related cataract, left eye: Secondary | ICD-10-CM | POA: Diagnosis not present

## 2021-06-21 HISTORY — PX: CATARACT EXTRACTION W/PHACO: SHX586

## 2021-06-21 SURGERY — PHACOEMULSIFICATION, CATARACT, WITH IOL INSERTION
Anesthesia: Monitor Anesthesia Care | Site: Eye | Laterality: Left

## 2021-06-21 MED ORDER — ACETAMINOPHEN 160 MG/5ML PO SOLN: 325.0000 mg | ORAL | Status: DC | PRN

## 2021-06-21 MED ORDER — LACTATED RINGERS IV SOLN: INTRAVENOUS | Status: DC

## 2021-06-21 MED ORDER — SIGHTPATH DOSE#1 NA HYALUR & NA CHOND-NA HYALUR IO KIT
PACK | INTRAOCULAR | Status: DC | PRN
Start: 2021-06-21 — End: 2021-06-21
  Administered 2021-06-21: 1 via OPHTHALMIC

## 2021-06-21 MED ORDER — FENTANYL CITRATE (PF) 100 MCG/2ML IJ SOLN
INTRAMUSCULAR | Status: DC | PRN
  Administered 2021-06-21: 50 ug via INTRAVENOUS

## 2021-06-21 MED ORDER — SIGHTPATH DOSE#1 BSS IO SOLN
INTRAOCULAR | Status: DC | PRN
Start: 2021-06-21 — End: 2021-06-21
  Administered 2021-06-21: 15 mL

## 2021-06-21 MED ORDER — ARMC OPHTHALMIC DILATING DROPS
1.0000 "application " | OPHTHALMIC | Status: DC | PRN
  Administered 2021-06-21 (×3): 1 via OPHTHALMIC

## 2021-06-21 MED ORDER — SIGHTPATH DOSE#1 BSS IO SOLN
INTRAOCULAR | Status: DC | PRN
  Administered 2021-06-21: 90 mL via OPHTHALMIC

## 2021-06-21 MED ORDER — MIDAZOLAM HCL 2 MG/2ML IJ SOLN
INTRAMUSCULAR | Status: DC | PRN
  Administered 2021-06-21: 2 mg via INTRAVENOUS

## 2021-06-21 MED ORDER — CEFUROXIME OPHTHALMIC INJECTION 1 MG/0.1 ML
INJECTION | OPHTHALMIC | Status: DC | PRN
  Administered 2021-06-21: 1 mg via OPHTHALMIC

## 2021-06-21 MED ORDER — TIMOLOL MALEATE 0.5 % OP SOLN
OPHTHALMIC | Status: DC | PRN
  Administered 2021-06-21: 1 [drp] via OPHTHALMIC

## 2021-06-21 MED ORDER — SIGHTPATH DOSE#1 BSS IO SOLN
INTRAOCULAR | Status: DC | PRN
  Administered 2021-06-21: 1 mL via INTRAMUSCULAR

## 2021-06-21 MED ORDER — TETRACAINE HCL 0.5 % OP SOLN
1.0000 [drp] | OPHTHALMIC | Status: DC | PRN
  Administered 2021-06-21 (×3): 1 [drp] via OPHTHALMIC

## 2021-06-21 MED ORDER — ACETAMINOPHEN 325 MG PO TABS: 325.0000 mg | ORAL_TABLET | ORAL | Status: DC | PRN

## 2021-06-21 SURGICAL SUPPLY — 11 items
CATARACT SUITE SIGHTPATH (MISCELLANEOUS) ×2 IMPLANT
FEE CATARACT SUITE SIGHTPATH (MISCELLANEOUS) ×1 IMPLANT
GLOVE SRG 8 PF TXTR STRL LF DI (GLOVE) ×1 IMPLANT
GLOVE SURG ENC TEXT LTX SZ7.5 (GLOVE) ×2 IMPLANT
GLOVE SURG UNDER POLY LF SZ8 (GLOVE) ×2
LENS IOL TECNIS EYHANCE 14.5 (Intraocular Lens) ×1 IMPLANT
NDL FILTER BLUNT 18X1 1/2 (NEEDLE) ×1 IMPLANT
NEEDLE FILTER BLUNT 18X 1/2SAF (NEEDLE) ×1
NEEDLE FILTER BLUNT 18X1 1/2 (NEEDLE) ×1 IMPLANT
SYR 3ML LL SCALE MARK (SYRINGE) ×2 IMPLANT
WATER STERILE IRR 250ML POUR (IV SOLUTION) ×2 IMPLANT

## 2021-06-21 NOTE — Anesthesia Postprocedure Evaluation (Signed)
Anesthesia Post Note ? ?Patient: Barbara Lewis ? ?Procedure(s) Performed: CATARACT EXTRACTION PHACO AND INTRAOCULAR LENS PLACEMENT (IOC) LEFT (Left: Eye) ? ? ?  ?Patient location during evaluation: PACU ?Anesthesia Type: MAC ?Level of consciousness: awake and alert ?Pain management: pain level controlled ?Vital Signs Assessment: post-procedure vital signs reviewed and stable ?Respiratory status: spontaneous breathing, nonlabored ventilation, respiratory function stable and patient connected to nasal cannula oxygen ?Cardiovascular status: stable and blood pressure returned to baseline ?Postop Assessment: no apparent nausea or vomiting ?Anesthetic complications: no ? ? ?No notable events documented. ? ?Trecia Rogers ? ? ? ? ? ?

## 2021-06-21 NOTE — Anesthesia Preprocedure Evaluation (Signed)
Anesthesia Evaluation  ?Patient identified by MRN, date of birth, ID band ?Patient awake ? ? ? ?Reviewed: ?Allergy & Precautions, H&P , NPO status , Patient's Chart, lab work & pertinent test results, reviewed documented beta blocker date and time  ? ?Airway ?Mallampati: II ? ?TM Distance: >3 FB ?Neck ROM: full ? ? ? Dental ?no notable dental hx. ? ?  ?Pulmonary ?former smoker,  ?  ?Pulmonary exam normal ?breath sounds clear to auscultation ? ? ? ? ? ? Cardiovascular ?Exercise Tolerance: Good ?hypertension, Normal cardiovascular exam ?Rhythm:regular Rate:Normal ? ? ?  ?Neuro/Psych ?negative neurological ROS ? negative psych ROS  ? GI/Hepatic ?negative GI ROS, Neg liver ROS,   ?Endo/Other  ?negative endocrine ROS ? Renal/GU ?negative Renal ROS  ?negative genitourinary ?  ?Musculoskeletal ? ? Abdominal ?  ?Peds ? Hematology ?negative hematology ROS ?(+)   ?Anesthesia Other Findings ? ? Reproductive/Obstetrics ?negative OB ROS ? ?  ? ? ? ? ? ? ? ? ? ? ? ? ? ?  ?  ? ? ? ? ? ? ? ? ?Anesthesia Physical ?Anesthesia Plan ? ?ASA: 2 ? ?Anesthesia Plan: MAC  ? ?Post-op Pain Management:   ? ?Induction:  ? ?PONV Risk Score and Plan:  ? ?Airway Management Planned:  ? ?Additional Equipment:  ? ?Intra-op Plan:  ? ?Post-operative Plan:  ? ?Informed Consent: I have reviewed the patients History and Physical, chart, labs and discussed the procedure including the risks, benefits and alternatives for the proposed anesthesia with the patient or authorized representative who has indicated his/her understanding and acceptance.  ? ? ? ?Dental Advisory Given ? ?Plan Discussed with: CRNA and Anesthesiologist ? ?Anesthesia Plan Comments:   ? ? ? ? ? ? ?Anesthesia Quick Evaluation ? ?

## 2021-06-21 NOTE — H&P (Signed)
?[facility]  ? ?Primary Care Physician:  Dion Body, MD ?Ophthalmologist: Dr. Leandrew Koyanagi ? ?Pre-Procedure History & Physical: ?HPI:  Barbara Lewis is a 71 y.o. female here for ophthalmic surgery. ?  ?Past Medical History:  ?Diagnosis Date  ? Anemia   ? Hematuria, microscopic   ? History of COVID-19 03/2021  ? Hypertension   ? Urinary frequency   ? Urinary incontinence   ? ? ?Past Surgical History:  ?Procedure Laterality Date  ? COLONOSCOPY WITH PROPOFOL N/A 07/15/2015  ? Procedure: COLONOSCOPY WITH PROPOFOL;  Surgeon: Lollie Sails, MD;  Location: [facility] ENDOSCOPY;  Service: Endoscopy;  Laterality: N/A;  ? CYSTOSCOPY WITH BIOPSY N/A 09/13/2014  ? Procedure: CYSTOSCOPY WITH BIOPSY;  Surgeon: Collier Flowers, MD;  Location: [facility] ORS;  Service: Urology;  Laterality: N/A;  ? ? ?Prior to Admission medications   ?Medication Sig Start Date End Date Taking? Authorizing Provider  ?lisinopril-hydrochlorothiazide (PRINZIDE,ZESTORETIC) 10-12.5 MG per tablet TAKE 1 TABLET BY MOUTH ONCE A DAY 08/27/14  Yes [provider]  ?conjugated estrogens (PREMARIN) vaginal cream apply 0.'5mg'$  (pea-sized amount)  just inside the vaginal introitus with a finger-tip every night for two weeks and then Monday, Wednesday and Friday nights. ?Patient not taking: Reported on 02/11/2015 11/19/14   Zara Council A, PA-C  ?mirabegron ER (MYRBETRIQ) 50 MG TB24 tablet Take 1 tablet (50 mg total) by mouth daily. ?Patient not taking: Reported on 02/11/2015 11/19/14   Zara Council A, PA-C  ? ? ?Allergies as of 04/28/2021  ? (No Known Allergies)  ? ? ?Family History  ?Problem Relation Age of Onset  ? Dementia Mother   ? Kidney cancer Neg Hx   ? Prostate cancer Neg Hx   ? Breast cancer Neg Hx   ? ? ?Social History  ? ?Socioeconomic History  ? Marital status: Widowed  ?  Spouse name: Not on file  ? Number of children: Not on file  ? Years of education: Not on file  ? Highest education level: Not on file  ?Occupational History   ? Not on file  ?Tobacco Use  ? Smoking status: Former  ?  Types: Cigarettes  ?  Quit date: 07/28/2010  ?  Years since quitting: 10.9  ? Smokeless tobacco: Not on file  ?Substance and Sexual Activity  ? Alcohol use: Yes  ?  Alcohol/week: 1.0 standard drink  ?  Types: 1 Standard drinks or equivalent per week  ? Drug use: No  ? Sexual activity: Not on file  ?Other Topics Concern  ? Not on file  ?Social History Narrative  ? Not on file  ? ?Social Determinants of Health  ? ?Financial Resource Strain: Not on file  ?Food Insecurity: Not on file  ?Transportation Needs: Not on file  ?Physical Activity: Not on file  ?Stress: Not on file  ?Social Connections: Not on file  ?Intimate Partner Violence: Not on file  ? ? ?Review of Systems: ?See HPI, otherwise negative ROS ? ?Physical Exam: ?BP 132/71   Pulse 63   Temp (!) 97.5 ?F (36.4 ?C) (Temporal)   Ht '5\' 2"'$  (1.575 m)   Wt 85.5 kg   LMP 07/20/2004   SpO2 96%   BMI 34.50 kg/m?  ?General:   Alert,  pleasant and cooperative in NAD ?Head:  Normocephalic and atraumatic. ?Lungs:  Clear to auscultation.    ?Heart:  Regular rate and rhythm.  ? ?Impression/Plan: ?Barbara Lewis is here for ophthalmic surgery. ? ?Risks, benefits, limitations, and alternatives regarding  ophthalmic surgery have been reviewed with the patient.  Questions have been answered.  All parties agreeable. ? ? Leandrew Koyanagi, MD  06/21/2021, 7:34 AM ? ?

## 2021-06-21 NOTE — Anesthesia Procedure Notes (Signed)
Procedure Name: Wilson ?Date/Time: 06/21/2021 8:24 AM ?Performed by: Jeannene Patella, CRNA ?Pre-anesthesia Checklist: Patient identified, Emergency Drugs available, Suction available, Timeout performed and Patient being monitored ?Patient Re-evaluated:Patient Re-evaluated prior to induction ?Oxygen Delivery Method: Nasal cannula ?Placement Confirmation: positive ETCO2 ? ? ? ? ?

## 2021-06-21 NOTE — Op Note (Signed)
OPERATIVE NOTE ? ?Barbara Lewis ?[id number] ?06/21/2021 ? ? ?PREOPERATIVE DIAGNOSIS:  Nuclear sclerotic cataract left eye. H25.12 ?  ?POSTOPERATIVE DIAGNOSIS:    Nuclear sclerotic cataract left eye.   ?  ?PROCEDURE:  Phacoemusification with posterior chamber intraocular lens placement of the left eye  ?Ultrasound time: Procedure(s) with comments: ?CATARACT EXTRACTION PHACO AND INTRAOCULAR LENS PLACEMENT (IOC) LEFT (Left) - 5.03 ?01:13.8 ? ?LENS:   ?Implant Name Type Inv. Item Serial No. Manufacturer Lot No. LRB No. Used Action  ?LENS IOL TECNIS EYHANCE 14.5 - [id number] Intraocular Lens LENS IOL TECNIS EYHANCE 14.5 [id number] [employer]  Left 1 Implanted  ?   ? ?SURGEON:  Wyonia Hough, MD ?  ?ANESTHESIA:  Topical with tetracaine drops and 2% Xylocaine jelly, augmented with 1% preservative-free intracameral lidocaine. ? ?  ?COMPLICATIONS:  None. ?  ?DESCRIPTION OF PROCEDURE:  The patient was identified in the holding room and transported to the operating room and placed in the supine position under the operating microscope.  The left eye was identified as the operative eye and it was prepped and draped in the usual sterile ophthalmic fashion. ?  ?A 1 millimeter clear-corneal paracentesis was made at the 1:30 position.  0.5 ml of preservative-free 1% lidocaine was injected into the anterior chamber. ? The anterior chamber was filled with Viscoat viscoelastic.  A 2.4 millimeter keratome was used to make a near-clear corneal incision at the 10:30 position.  .  A curvilinear capsulorrhexis was made with a cystotome and capsulorrhexis forceps.  Balanced salt solution was used to hydrodissect and hydrodelineate the nucleus. ?  ?Phacoemulsification was then used in stop and chop fashion to remove the lens nucleus and epinucleus.  The remaining cortex was then removed using the irrigation and aspiration handpiece. Provisc was then placed into the capsular bag to distend it for lens placement.  A lens was then injected  into the capsular bag.  The remaining viscoelastic was aspirated. ?  ?Wounds were hydrated with balanced salt solution.  The anterior chamber was inflated to a physiologic pressure with balanced salt solution.  No wound leaks were noted. Cefuroxime 0.1 ml of a '10mg'$ /ml solution was injected into the anterior chamber for a dose of 1 mg of intracameral antibiotic at the completion of the case. ?  Timolol drops were applied to the eye.  The patient was taken to the recovery room in stable condition without complications of anesthesia or surgery. ? ?[name] ?06/21/2021, 8:38 AM ? ?

## 2021-06-21 NOTE — Transfer of Care (Signed)
Immediate Anesthesia Transfer of Care Note ? ?Patient: Barbara Lewis ? ?Procedure(s) Performed: CATARACT EXTRACTION PHACO AND INTRAOCULAR LENS PLACEMENT (IOC) LEFT (Left: Eye) ? ?Patient Location: PACU ? ?Anesthesia Type: MAC ? ?Level of Consciousness: awake, alert  and patient cooperative ? ?Airway and Oxygen Therapy: Patient Spontanous Breathing and Patient connected to supplemental oxygen ? ?Post-op Assessment: Post-op Vital signs reviewed, Patient's Cardiovascular Status Stable, Respiratory Function Stable, Patent Airway and No signs of Nausea or vomiting ? ?Post-op Vital Signs: Reviewed and stable ? ?Complications: No notable events documented. ? ?

## 2021-06-22 ENCOUNTER — Encounter: Payer: Self-pay | Admitting: Ophthalmology

## 2021-06-27 ENCOUNTER — Encounter: Payer: Self-pay | Admitting: Ophthalmology

## 2021-06-27 DIAGNOSIS — H2511 Age-related nuclear cataract, right eye: Secondary | ICD-10-CM | POA: Diagnosis not present

## 2021-07-03 NOTE — Discharge Instructions (Signed)

## 2021-07-05 ENCOUNTER — Ambulatory Visit: Payer: PPO | Admitting: Anesthesiology

## 2021-07-05 ENCOUNTER — Encounter: Payer: Self-pay | Admitting: Ophthalmology

## 2021-07-05 ENCOUNTER — Other Ambulatory Visit: Payer: Self-pay

## 2021-07-05 ENCOUNTER — Ambulatory Visit
Admission: RE | Admit: 2021-07-05 | Discharge: 2021-07-05 | Disposition: A | Discharge status: Home / Self Care | Payer: PPO | Attending: Ophthalmology | Admitting: Ophthalmology

## 2021-07-05 ENCOUNTER — Encounter
Admission: RE | Disposition: A | Discharge status: Home / Self Care | Payer: Self-pay | Source: Home / Self Care | Attending: Ophthalmology

## 2021-07-05 DIAGNOSIS — H25811 Combined forms of age-related cataract, right eye: Secondary | ICD-10-CM | POA: Diagnosis not present

## 2021-07-05 DIAGNOSIS — H2511 Age-related nuclear cataract, right eye: Secondary | ICD-10-CM | POA: Insufficient documentation

## 2021-07-05 DIAGNOSIS — Z87891 Personal history of nicotine dependence: Secondary | ICD-10-CM | POA: Insufficient documentation

## 2021-07-05 DIAGNOSIS — I10 Essential (primary) hypertension: Secondary | ICD-10-CM | POA: Diagnosis not present

## 2021-07-05 HISTORY — PX: CATARACT EXTRACTION W/PHACO: SHX586

## 2021-07-05 SURGERY — PHACOEMULSIFICATION, CATARACT, WITH IOL INSERTION
Anesthesia: Monitor Anesthesia Care | Site: Eye | Laterality: Right

## 2021-07-05 MED ORDER — TIMOLOL MALEATE 0.5 % OP SOLN
OPHTHALMIC | Status: DC | PRN
  Administered 2021-07-05: 1 [drp] via OPHTHALMIC

## 2021-07-05 MED ORDER — FENTANYL CITRATE (PF) 100 MCG/2ML IJ SOLN
INTRAMUSCULAR | Status: DC | PRN
  Administered 2021-07-05: 50 ug via INTRAVENOUS

## 2021-07-05 MED ORDER — EPINEPHRINE PF 1 MG/ML IJ SOLN
INTRAMUSCULAR | Status: DC | PRN
  Administered 2021-07-05: 61 mL via OPHTHALMIC

## 2021-07-05 MED ORDER — ACETAMINOPHEN 325 MG PO TABS: 325.0000 mg | ORAL_TABLET | ORAL | Status: DC | PRN

## 2021-07-05 MED ORDER — TETRACAINE HCL 0.5 % OP SOLN
1.0000 [drp] | OPHTHALMIC | Status: DC | PRN
  Administered 2021-07-05 (×3): 1 [drp] via OPHTHALMIC

## 2021-07-05 MED ORDER — ONDANSETRON HCL 4 MG/2ML IJ SOLN: 4.0000 mg | Freq: Once | INTRAMUSCULAR | Status: DC | PRN

## 2021-07-05 MED ORDER — LIDOCAINE HCL (PF) 2 % IJ SOLN
INTRAMUSCULAR | Status: DC | PRN
  Administered 2021-07-05: 2 mL

## 2021-07-05 MED ORDER — MIDAZOLAM HCL 2 MG/2ML IJ SOLN
INTRAMUSCULAR | Status: DC | PRN
  Administered 2021-07-05: 2 mg via INTRAVENOUS

## 2021-07-05 MED ORDER — CYCLOPENTOLATE HCL 2 % OP SOLN
1.0000 [drp] | OPHTHALMIC | Status: DC | PRN
Start: 2021-07-05 — End: 2021-07-05
  Administered 2021-07-05 (×3): 1 [drp] via OPHTHALMIC

## 2021-07-05 MED ORDER — SIGHTPATH DOSE#1 BSS IO SOLN
INTRAOCULAR | Status: DC | PRN
  Administered 2021-07-05: 15 mL via INTRAOCULAR

## 2021-07-05 MED ORDER — PHENYLEPHRINE HCL 10 % OP SOLN
1.0000 [drp] | OPHTHALMIC | Status: DC | PRN
  Administered 2021-07-05 (×3): 1 [drp] via OPHTHALMIC

## 2021-07-05 MED ORDER — ACETAMINOPHEN 160 MG/5ML PO SOLN: 325.0000 mg | ORAL | Status: DC | PRN

## 2021-07-05 MED ORDER — SIGHTPATH DOSE#1 NA HYALUR & NA CHOND-NA HYALUR IO KIT
PACK | INTRAOCULAR | Status: DC | PRN
  Administered 2021-07-05: 1 via OPHTHALMIC

## 2021-07-05 SURGICAL SUPPLY — 11 items
CATARACT SUITE SIGHTPATH (MISCELLANEOUS) ×2 IMPLANT
FEE CATARACT SUITE SIGHTPATH (MISCELLANEOUS) ×1 IMPLANT
GLOVE SRG 8 PF TXTR STRL LF DI (GLOVE) ×1 IMPLANT
GLOVE SURG ENC TEXT LTX SZ7.5 (GLOVE) ×2 IMPLANT
GLOVE SURG UNDER POLY LF SZ8 (GLOVE) ×2
LENS IOL TECNIS EYHANCE 14.5 (Intraocular Lens) ×1 IMPLANT
NDL FILTER BLUNT 18X1 1/2 (NEEDLE) ×1 IMPLANT
NEEDLE FILTER BLUNT 18X 1/2SAF (NEEDLE) ×1
NEEDLE FILTER BLUNT 18X1 1/2 (NEEDLE) ×1 IMPLANT
SYR 3ML LL SCALE MARK (SYRINGE) ×2 IMPLANT
WATER STERILE IRR 250ML POUR (IV SOLUTION) ×2 IMPLANT

## 2021-07-05 NOTE — Transfer of Care (Signed)
Immediate Anesthesia Transfer of Care Note ? ?Patient: Barbara Lewis ? ?Procedure(s) Performed: CATARACT EXTRACTION PHACO AND INTRAOCULAR LENS PLACEMENT (IOC) RIGHT 6.82 00:53.2 (Right: Eye) ? ?Patient Location: PACU ? ?Anesthesia Type: MAC ? ?Level of Consciousness: awake, alert  and patient cooperative ? ?Airway and Oxygen Therapy: Patient Spontanous Breathing and Patient connected to supplemental oxygen ? ?Post-op Assessment: Post-op Vital signs reviewed, Patient's Cardiovascular Status Stable, Respiratory Function Stable, Patent Airway and No signs of Nausea or vomiting ? ?Post-op Vital Signs: Reviewed and stable ? ?Complications: No notable events documented. ? ?

## 2021-07-05 NOTE — H&P (Signed)
?[facility]  ? ?Primary Care Physician:  Dion Body, MD ?Ophthalmologist: Dr. Leandrew Koyanagi ? ?Pre-Procedure History & Physical: ?HPI:  Barbara Lewis is a 71 y.o. female here for ophthalmic surgery. ?  ?Past Medical History:  ?Diagnosis Date  ? Anemia   ? Hematuria, microscopic   ? History of COVID-19 03/2021  ? Hypertension   ? Urinary frequency   ? Urinary incontinence   ? ? ?Past Surgical History:  ?Procedure Laterality Date  ? CATARACT EXTRACTION W/PHACO Left 06/21/2021  ? Procedure: CATARACT EXTRACTION PHACO AND INTRAOCULAR LENS PLACEMENT ([place]) LEFT;  Surgeon: Leandrew Koyanagi, MD;  Location: [place];  Service: Ophthalmology;  Laterality: Left;  5.03 ?01:13.8  ? COLONOSCOPY WITH PROPOFOL N/A 07/15/2015  ? Procedure: COLONOSCOPY WITH PROPOFOL;  Surgeon: Lollie Sails, MD;  Location: [facility] ENDOSCOPY;  Service: Endoscopy;  Laterality: N/A;  ? CYSTOSCOPY WITH BIOPSY N/A 09/13/2014  ? Procedure: CYSTOSCOPY WITH BIOPSY;  Surgeon: Collier Flowers, MD;  Location: [facility] ORS;  Service: Urology;  Laterality: N/A;  ? ? ?Prior to Admission medications   ?Medication Sig Start Date End Date Taking? Authorizing Provider  ?lisinopril-hydrochlorothiazide (PRINZIDE,ZESTORETIC) 10-12.5 MG per tablet TAKE 1 TABLET BY MOUTH ONCE A DAY 08/27/14  Yes [provider]  ?conjugated estrogens (PREMARIN) vaginal cream apply 0.'5mg'$  (pea-sized amount)  just inside the vaginal introitus with a finger-tip every night for two weeks and then Monday, Wednesday and Friday nights. ?Patient not taking: Reported on 02/11/2015 11/19/14   Zara Council A, PA-C  ?mirabegron ER (MYRBETRIQ) 50 MG TB24 tablet Take 1 tablet (50 mg total) by mouth daily. ?Patient not taking: Reported on 02/11/2015 11/19/14   Zara Council A, PA-C  ? ? ?Allergies as of 04/28/2021  ? (No Known Allergies)  ? ? ?Family History  ?Problem Relation Age of Onset  ? Dementia Mother   ? Kidney cancer Neg Hx   ? Prostate cancer Neg Hx   ?  Breast cancer Neg Hx   ? ? ?Social History  ? ?Socioeconomic History  ? Marital status: Widowed  ?  Spouse name: Not on file  ? Number of children: Not on file  ? Years of education: Not on file  ? Highest education level: Not on file  ?Occupational History  ? Not on file  ?Tobacco Use  ? Smoking status: Former  ?  Types: Cigarettes  ?  Quit date: 07/28/2010  ?  Years since quitting: 10.9  ? Smokeless tobacco: Not on file  ?Substance and Sexual Activity  ? Alcohol use: Yes  ?  Alcohol/week: 1.0 standard drink  ?  Types: 1 Standard drinks or equivalent per week  ? Drug use: No  ? Sexual activity: Not on file  ?Other Topics Concern  ? Not on file  ?Social History Narrative  ? Not on file  ? ?Social Determinants of Health  ? ?Financial Resource Strain: Not on file  ?Food Insecurity: Not on file  ?Transportation Needs: Not on file  ?Physical Activity: Not on file  ?Stress: Not on file  ?Social Connections: Not on file  ?Intimate Partner Violence: Not on file  ? ? ?Review of Systems: ?See HPI, otherwise negative ROS ? ?Physical Exam: ?BP 131/70   Pulse 72   Temp (!) 97.5 ?F (36.4 ?C) (Temporal)   Ht '5\' 2"'$  (1.575 m)   Wt 85 kg   LMP 07/20/2004   SpO2 97%   BMI 34.29 kg/m?  ?General:   Alert,  pleasant and cooperative in  NAD ?Head:  Normocephalic and atraumatic. ?Lungs:  Clear to auscultation.    ?Heart:  Regular rate and rhythm.  ? ?Impression/Plan: ?Barbara Lewis is here for ophthalmic surgery. ? ?Risks, benefits, limitations, and alternatives regarding ophthalmic surgery have been reviewed with the patient.  Questions have been answered.  All parties agreeable. ? ? Leandrew Koyanagi, MD  07/05/2021, 8:36 AM ? ?

## 2021-07-05 NOTE — Op Note (Signed)
?  LOCATION:  [place] ?  ?PREOPERATIVE DIAGNOSIS:    Nuclear sclerotic cataract right eye. H25.11 ?  ?POSTOPERATIVE DIAGNOSIS:  Nuclear sclerotic cataract right eye.   ?  ?PROCEDURE:  Phacoemusification with posterior chamber intraocular lens placement of the right eye  ? ?ULTRASOUND TIME: Procedure(s) with comments: ?CATARACT EXTRACTION PHACO AND INTRAOCULAR LENS PLACEMENT (IOC) RIGHT 6.82 00:53.2 (Right) - wants 8 or 9 arrival ? ?LENS:   ?Implant Name Type Inv. Item Serial No. Manufacturer Lot No. LRB No. Used Action  ?LENS IOL TECNIS EYHANCE 14.5 - [id number] Intraocular Lens LENS IOL TECNIS EYHANCE 14.5 [id number] [employer]  Right 1 Implanted  ?   ?  ?  ?SURGEON:  Wyonia Hough, MD ?  ?ANESTHESIA:  Topical with tetracaine drops and 2% Xylocaine jelly, augmented with 1% preservative-free intracameral lidocaine. ? ?  ?COMPLICATIONS:  None. ?  ?DESCRIPTION OF PROCEDURE:  The patient was identified in the holding room and transported to the operating room and placed in the supine position under the operating microscope.  The right eye was identified as the operative eye and it was prepped and draped in the usual sterile ophthalmic fashion. ?  ?A 1 millimeter clear-corneal paracentesis was made at the 12:00 position.  0.5 ml of preservative-free 1% lidocaine was injected into the anterior chamber. ?The anterior chamber was filled with Viscoat viscoelastic.  A 2.4 millimeter keratome was used to make a near-clear corneal incision at the 9:00 position.  A curvilinear capsulorrhexis was made with a cystotome and capsulorrhexis forceps.  Balanced salt solution was used to hydrodissect and hydrodelineate the nucleus. ?  ?Phacoemulsification was then used in stop and chop fashion to remove the lens nucleus and epinucleus.  The remaining cortex was then removed using the irrigation and aspiration handpiece. Provisc was then placed into the capsular bag to distend it for lens placement.  A lens was  then injected into the capsular bag.  The remaining viscoelastic was aspirated. ?  ?Wounds were hydrated with balanced salt solution.  The anterior chamber was inflated to a physiologic pressure with balanced salt solution.  No wound leaks were noted. Cefuroxime 0.1 ml of a '10mg'$ /ml solution was injected into the anterior chamber for a dose of 1 mg of intracameral antibiotic at the completion of the case. ?  Timolol drops were applied to the eye.  The patient was taken to the recovery room in stable condition without complications of anesthesia or surgery. ? ? ?Kc Sedlak ?07/05/2021, 9:17 AM ? ?

## 2021-07-05 NOTE — Anesthesia Preprocedure Evaluation (Signed)
Anesthesia Evaluation  ?Patient identified by MRN, date of birth, ID band ?Patient awake ? ? ? ?Reviewed: ?Allergy & Precautions, H&P , NPO status , Patient's Chart, lab work & pertinent test results ? ?Airway ?Mallampati: II ? ?TM Distance: >3 FB ?Neck ROM: full ? ? ? Dental ?no notable dental hx. ? ?  ?Pulmonary ?former smoker,  ?  ?breath sounds clear to auscultation ? ? ? ? ? ? Cardiovascular ?Exercise Tolerance: Good ?hypertension,  ?Rhythm:regular Rate:Normal ? ? ?  ?Neuro/Psych ?  ? GI/Hepatic ?  ?Endo/Other  ? ? Renal/GU ?  ? ?  ?Musculoskeletal ? ? Abdominal ?  ?Peds ? Hematology ?  ?Anesthesia Other Findings ? ? Reproductive/Obstetrics ? ?  ? ? ? ? ? ? ? ? ? ? ? ? ? ?  ?  ? ? ? ? ? ? ? ? ?Anesthesia Physical ? ?Anesthesia Plan ? ?ASA: 2 ? ?Anesthesia Plan: MAC  ? ?Post-op Pain Management: Minimal or no pain anticipated  ? ?Induction:  ? ?PONV Risk Score and Plan: 2 and Treatment may vary due to age or medical condition, Midazolam and TIVA ? ?Airway Management Planned:  ? ?Additional Equipment:  ? ?Intra-op Plan:  ? ?Post-operative Plan:  ? ?Informed Consent: I have reviewed the patients History and Physical, chart, labs and discussed the procedure including the risks, benefits and alternatives for the proposed anesthesia with the patient or authorized representative who has indicated his/her understanding and acceptance.  ? ? ? ?Dental Advisory Given ? ?Plan Discussed with: CRNA ? ?Anesthesia Plan Comments:   ? ? ? ? ? ? ?Anesthesia Quick Evaluation ? ?

## 2021-07-05 NOTE — Anesthesia Postprocedure Evaluation (Signed)
Anesthesia Post Note ? ?Patient: Barbara Lewis ? ?Procedure(s) Performed: CATARACT EXTRACTION PHACO AND INTRAOCULAR LENS PLACEMENT ([facility]) RIGHT 6.82 00:53.2 (Right: Eye) ? ? ?  ?Patient location during evaluation: PACU ?Anesthesia Type: MAC ?Level of consciousness: awake ?Pain management: pain level controlled ?Vital Signs Assessment: post-procedure vital signs reviewed and stable ?Respiratory status: respiratory function stable ?Cardiovascular status: stable ?Postop Assessment: no apparent nausea or vomiting ?Anesthetic complications: no ? ? ?No notable events documented. ? ?Veda Canning ? ? ? ? ? ?

## 2021-07-06 ENCOUNTER — Encounter: Payer: Self-pay | Admitting: Ophthalmology

## 2021-11-17 DIAGNOSIS — I10 Essential (primary) hypertension: Secondary | ICD-10-CM | POA: Diagnosis not present

## 2021-11-24 DIAGNOSIS — Z136 Encounter for screening for cardiovascular disorders: Secondary | ICD-10-CM | POA: Diagnosis not present

## 2021-11-24 DIAGNOSIS — E669 Obesity, unspecified: Secondary | ICD-10-CM | POA: Diagnosis not present

## 2021-11-24 DIAGNOSIS — I10 Essential (primary) hypertension: Secondary | ICD-10-CM | POA: Diagnosis not present

## 2021-11-28 DIAGNOSIS — H401131 Primary open-angle glaucoma, bilateral, mild stage: Secondary | ICD-10-CM | POA: Diagnosis not present

## 2022-05-31 DIAGNOSIS — R5383 Other fatigue: Secondary | ICD-10-CM | POA: Diagnosis not present

## 2022-05-31 DIAGNOSIS — E538 Deficiency of other specified B group vitamins: Secondary | ICD-10-CM | POA: Diagnosis not present

## 2022-05-31 DIAGNOSIS — E669 Obesity, unspecified: Secondary | ICD-10-CM | POA: Diagnosis not present

## 2022-05-31 DIAGNOSIS — I10 Essential (primary) hypertension: Secondary | ICD-10-CM | POA: Diagnosis not present

## 2022-05-31 DIAGNOSIS — Z136 Encounter for screening for cardiovascular disorders: Secondary | ICD-10-CM | POA: Diagnosis not present

## 2022-05-31 DIAGNOSIS — M25552 Pain in left hip: Secondary | ICD-10-CM | POA: Diagnosis not present

## 2022-06-05 DIAGNOSIS — Z961 Presence of intraocular lens: Secondary | ICD-10-CM | POA: Diagnosis not present

## 2022-06-05 DIAGNOSIS — H401131 Primary open-angle glaucoma, bilateral, mild stage: Secondary | ICD-10-CM | POA: Diagnosis not present

## 2022-06-15 DIAGNOSIS — E669 Obesity, unspecified: Secondary | ICD-10-CM | POA: Diagnosis not present

## 2022-06-15 DIAGNOSIS — I10 Essential (primary) hypertension: Secondary | ICD-10-CM | POA: Diagnosis not present

## 2022-06-15 DIAGNOSIS — L309 Dermatitis, unspecified: Secondary | ICD-10-CM | POA: Diagnosis not present

## 2022-06-15 DIAGNOSIS — Z0000 Encounter for general adult medical examination without abnormal findings: Secondary | ICD-10-CM | POA: Diagnosis not present

## 2022-06-15 DIAGNOSIS — E538 Deficiency of other specified B group vitamins: Secondary | ICD-10-CM | POA: Diagnosis not present

## 2022-06-15 DIAGNOSIS — E559 Vitamin D deficiency, unspecified: Secondary | ICD-10-CM | POA: Diagnosis not present

## 2022-09-28 DIAGNOSIS — I10 Essential (primary) hypertension: Secondary | ICD-10-CM | POA: Diagnosis not present

## 2022-09-28 DIAGNOSIS — E559 Vitamin D deficiency, unspecified: Secondary | ICD-10-CM | POA: Diagnosis not present

## 2022-09-28 DIAGNOSIS — E538 Deficiency of other specified B group vitamins: Secondary | ICD-10-CM | POA: Diagnosis not present

## 2022-10-10 DIAGNOSIS — E559 Vitamin D deficiency, unspecified: Secondary | ICD-10-CM | POA: Diagnosis not present

## 2022-10-10 DIAGNOSIS — E538 Deficiency of other specified B group vitamins: Secondary | ICD-10-CM | POA: Diagnosis not present

## 2022-10-10 DIAGNOSIS — I10 Essential (primary) hypertension: Secondary | ICD-10-CM | POA: Diagnosis not present

## 2022-11-20 DIAGNOSIS — L659 Nonscarring hair loss, unspecified: Secondary | ICD-10-CM | POA: Diagnosis not present

## 2022-11-20 DIAGNOSIS — L819 Disorder of pigmentation, unspecified: Secondary | ICD-10-CM | POA: Diagnosis not present

## 2022-11-20 DIAGNOSIS — R238 Other skin changes: Secondary | ICD-10-CM | POA: Diagnosis not present

## 2022-11-30 DIAGNOSIS — Z961 Presence of intraocular lens: Secondary | ICD-10-CM | POA: Diagnosis not present

## 2022-11-30 DIAGNOSIS — H401131 Primary open-angle glaucoma, bilateral, mild stage: Secondary | ICD-10-CM | POA: Diagnosis not present

## 2022-12-06 DIAGNOSIS — L309 Dermatitis, unspecified: Secondary | ICD-10-CM | POA: Diagnosis not present

## 2022-12-06 DIAGNOSIS — L853 Xerosis cutis: Secondary | ICD-10-CM | POA: Diagnosis not present

## 2023-01-17 DIAGNOSIS — L309 Dermatitis, unspecified: Secondary | ICD-10-CM | POA: Diagnosis not present

## 2023-03-05 ENCOUNTER — Other Ambulatory Visit: Payer: Self-pay | Admitting: Family Medicine

## 2023-03-05 DIAGNOSIS — Z1231 Encounter for screening mammogram for malignant neoplasm of breast: Secondary | ICD-10-CM

## 2023-03-15 ENCOUNTER — Ambulatory Visit
Admission: RE | Admit: 2023-03-15 | Discharge: 2023-03-15 | Disposition: A | Discharge status: Home / Self Care | Payer: PPO | Source: Ambulatory Visit | Attending: Family Medicine | Admitting: Family Medicine

## 2023-03-15 DIAGNOSIS — Z1231 Encounter for screening mammogram for malignant neoplasm of breast: Secondary | ICD-10-CM | POA: Diagnosis not present

## 2023-04-23 DIAGNOSIS — I10 Essential (primary) hypertension: Secondary | ICD-10-CM | POA: Diagnosis not present

## 2023-04-23 DIAGNOSIS — E559 Vitamin D deficiency, unspecified: Secondary | ICD-10-CM | POA: Diagnosis not present

## 2023-04-23 DIAGNOSIS — E538 Deficiency of other specified B group vitamins: Secondary | ICD-10-CM | POA: Diagnosis not present

## 2023-04-30 DIAGNOSIS — E669 Obesity, unspecified: Secondary | ICD-10-CM | POA: Diagnosis not present

## 2023-04-30 DIAGNOSIS — Z136 Encounter for screening for cardiovascular disorders: Secondary | ICD-10-CM | POA: Diagnosis not present

## 2023-04-30 DIAGNOSIS — E538 Deficiency of other specified B group vitamins: Secondary | ICD-10-CM | POA: Diagnosis not present

## 2023-04-30 DIAGNOSIS — E559 Vitamin D deficiency, unspecified: Secondary | ICD-10-CM | POA: Diagnosis not present

## 2023-04-30 DIAGNOSIS — I10 Essential (primary) hypertension: Secondary | ICD-10-CM | POA: Diagnosis not present

## 2023-05-31 DIAGNOSIS — H401131 Primary open-angle glaucoma, bilateral, mild stage: Secondary | ICD-10-CM | POA: Diagnosis not present

## 2023-06-06 DIAGNOSIS — Z961 Presence of intraocular lens: Secondary | ICD-10-CM | POA: Diagnosis not present

## 2023-06-06 DIAGNOSIS — H401131 Primary open-angle glaucoma, bilateral, mild stage: Secondary | ICD-10-CM | POA: Diagnosis not present

## 2023-10-29 DIAGNOSIS — Z136 Encounter for screening for cardiovascular disorders: Secondary | ICD-10-CM | POA: Diagnosis not present

## 2023-10-29 DIAGNOSIS — E538 Deficiency of other specified B group vitamins: Secondary | ICD-10-CM | POA: Diagnosis not present

## 2023-10-29 DIAGNOSIS — I10 Essential (primary) hypertension: Secondary | ICD-10-CM | POA: Diagnosis not present

## 2023-11-05 DIAGNOSIS — Z0000 Encounter for general adult medical examination without abnormal findings: Secondary | ICD-10-CM | POA: Diagnosis not present

## 2023-11-05 DIAGNOSIS — Z1331 Encounter for screening for depression: Secondary | ICD-10-CM | POA: Diagnosis not present

## 2023-11-05 DIAGNOSIS — I10 Essential (primary) hypertension: Secondary | ICD-10-CM | POA: Diagnosis not present

## 2023-12-13 DIAGNOSIS — H401131 Primary open-angle glaucoma, bilateral, mild stage: Secondary | ICD-10-CM | POA: Diagnosis not present

## 2023-12-13 DIAGNOSIS — Z961 Presence of intraocular lens: Secondary | ICD-10-CM | POA: Diagnosis not present

## 2023-12-13 NOTE — Progress Notes (Addendum)
 GERRE RANUM                                          MRN: [id number]   12/13/2023   The [facility] Quality Team Specialist reviewed this patient medical record for the purposes of chart review for care gap closure. The following were reviewed: chart review for care gap closure-controlling blood pressure.  01/31/2024- cbp out of range    [facility] Quality Team

## 2024-01-08 ENCOUNTER — Observation Stay
Admission: EM | Admit: 2024-01-08 | Discharge: 2024-01-10 | Disposition: A | Discharge status: Home / Self Care | Attending: General Surgery | Admitting: General Surgery

## 2024-01-08 DIAGNOSIS — K8066 Calculus of gallbladder and bile duct with acute and chronic cholecystitis without obstruction: Secondary | ICD-10-CM | POA: Diagnosis not present

## 2024-01-08 DIAGNOSIS — R112 Nausea with vomiting, unspecified: Secondary | ICD-10-CM | POA: Diagnosis not present

## 2024-01-08 DIAGNOSIS — Z79899 Other long term (current) drug therapy: Secondary | ICD-10-CM | POA: Insufficient documentation

## 2024-01-08 DIAGNOSIS — I959 Hypotension, unspecified: Secondary | ICD-10-CM | POA: Diagnosis not present

## 2024-01-08 DIAGNOSIS — K8020 Calculus of gallbladder without cholecystitis without obstruction: Secondary | ICD-10-CM | POA: Diagnosis not present

## 2024-01-08 DIAGNOSIS — Z87891 Personal history of nicotine dependence: Secondary | ICD-10-CM | POA: Diagnosis not present

## 2024-01-08 DIAGNOSIS — R079 Chest pain, unspecified: Principal | ICD-10-CM

## 2024-01-08 DIAGNOSIS — F1090 Alcohol use, unspecified, uncomplicated: Secondary | ICD-10-CM | POA: Insufficient documentation

## 2024-01-08 DIAGNOSIS — I10 Essential (primary) hypertension: Secondary | ICD-10-CM | POA: Insufficient documentation

## 2024-01-08 DIAGNOSIS — K8070 Calculus of gallbladder and bile duct without cholecystitis without obstruction: Secondary | ICD-10-CM | POA: Diagnosis not present

## 2024-01-08 DIAGNOSIS — K819 Cholecystitis, unspecified: Secondary | ICD-10-CM | POA: Diagnosis present

## 2024-01-08 DIAGNOSIS — K8050 Calculus of bile duct without cholangitis or cholecystitis without obstruction: Secondary | ICD-10-CM

## 2024-01-08 DIAGNOSIS — R0789 Other chest pain: Secondary | ICD-10-CM | POA: Diagnosis not present

## 2024-01-08 LAB — CBC WITH DIFFERENTIAL/PLATELET
Abs Immature Granulocytes: 0.04 K/uL (ref 0.00–0.07)
Basophils Absolute: 0.1 K/uL (ref 0.0–0.1)
Basophils Relative: 0 %
Eosinophils Absolute: 0.1 K/uL (ref 0.0–0.5)
Eosinophils Relative: 1 %
HCT: 37 % (ref 36.0–46.0)
Hemoglobin: 12.4 g/dL (ref 12.0–15.0)
Immature Granulocytes: 0 %
Lymphocytes Relative: 17 %
Lymphs Abs: 2 K/uL (ref 0.7–4.0)
MCH: 32.4 pg (ref 26.0–34.0)
MCHC: 33.5 g/dL (ref 30.0–36.0)
MCV: 96.6 fL (ref 80.0–100.0)
Monocytes Absolute: 0.7 K/uL (ref 0.1–1.0)
Monocytes Relative: 6 %
Neutro Abs: 8.6 K/uL — ABNORMAL HIGH (ref 1.7–7.7)
Neutrophils Relative %: 76 %
Platelets: 213 K/uL (ref 150–400)
RBC: 3.83 MIL/uL — ABNORMAL LOW (ref 3.87–5.11)
RDW: 11.1 % — ABNORMAL LOW (ref 11.5–15.5)
WBC: 11.4 K/uL — ABNORMAL HIGH (ref 4.0–10.5)
nRBC: 0 % (ref 0.0–0.2)

## 2024-01-08 LAB — APTT: aPTT: 23 s — ABNORMAL LOW (ref 24–36)

## 2024-01-08 LAB — PROTIME-INR
INR: 0.9 (ref 0.8–1.2)
Prothrombin Time: 12.9 s (ref 11.4–15.2)

## 2024-01-08 LAB — COMPREHENSIVE METABOLIC PANEL WITH GFR
ALT: 13 U/L (ref 0–44)
AST: 15 U/L (ref 15–41)
Albumin: 4.2 g/dL (ref 3.5–5.0)
Alkaline Phosphatase: 71 U/L (ref 38–126)
Anion gap: 11 (ref 5–15)
BUN: 18 mg/dL (ref 8–23)
CO2: 27 mmol/L (ref 22–32)
Calcium: 9.3 mg/dL (ref 8.9–10.3)
Chloride: 105 mmol/L (ref 98–111)
Creatinine, Ser: 0.74 mg/dL (ref 0.44–1.00)
GFR, Estimated: >60 mL/min (ref >60)
Glucose, Bld: 166 mg/dL — ABNORMAL HIGH (ref 70–99)
Potassium: 3.6 mmol/L (ref 3.5–5.1)
Sodium: 142 mmol/L (ref 135–145)
Total Bilirubin: 0.4 mg/dL (ref 0.0–1.2)
Total Protein: 6.5 g/dL (ref 6.5–8.1)

## 2024-01-08 LAB — TROPONIN T, HIGH SENSITIVITY: Troponin T High Sensitivity: <15 ng/L (ref 0–19)

## 2024-01-08 MED ORDER — MORPHINE SULFATE (PF) 2 MG/ML IV SOLN
2.0000 mg | INTRAVENOUS | Status: AC | PRN
  Administered 2024-01-09 (×2): 2 mg via INTRAVENOUS
  Filled 2024-01-08 (×2): qty 1

## 2024-01-08 MED ORDER — SODIUM CHLORIDE 0.9 % IV BOLUS
500.0000 mL | Freq: Once | INTRAVENOUS | Status: AC
  Administered 2024-01-09: 500 mL via INTRAVENOUS

## 2024-01-08 MED ORDER — KETOROLAC TROMETHAMINE 15 MG/ML IJ SOLN
15.0000 mg | Freq: Once | INTRAMUSCULAR | Status: AC
  Administered 2024-01-09: 15 mg via INTRAVENOUS
  Filled 2024-01-08: qty 1

## 2024-01-08 MED ORDER — ONDANSETRON HCL 4 MG/2ML IJ SOLN
4.0000 mg | Freq: Once | INTRAMUSCULAR | Status: AC
  Administered 2024-01-09: 4 mg via INTRAVENOUS
  Filled 2024-01-08: qty 2

## 2024-01-08 NOTE — ED Provider Notes (Signed)
 [facility] Provider Note    Event Date/Time   First MD Initiated Contact with Patient 01/08/24 2300     (approximate)   History   Chest Pain   HPI  Barbara Lewis is a 73 y.o. female   Past medical history of hypertension who presents with chest pain starting about 3 hours ago while at rest watching TV after dinner.  Associated with nausea and 1 episode of vomiting.  Was in her regular state of health beforehand with no recent illnesses or medical complaints.  Has had no surgical history.  Bowel movements have been normal, no urinary symptoms, and no GI bleeding associated with her vomiting.  No alcohol use.   External Medical Documents Reviewed: Prior outpatient notes      Physical Exam   Triage Vital Signs: ED Triage Vitals  Encounter Vitals Group     BP 01/08/24 2308 120/72     Girls Systolic BP Percentile --      Girls Diastolic BP Percentile --      Boys Systolic BP Percentile --      Boys Diastolic BP Percentile --      Pulse Rate 01/08/24 2308 (!) 52     Resp 01/08/24 2308 20     Temp 01/08/24 2308 98.3 F (36.8 C)     Temp Source 01/08/24 2308 Oral     SpO2 01/08/24 2308 99 %     Weight 01/08/24 2312 185 lb 10 oz (84.2 kg)     Height 01/08/24 2312 5' 2 (1.575 m)     Head Circumference --      Peak Flow --      Pain Score 01/08/24 2308 8     Pain Loc --      Pain Education --      Exclude from Growth Chart --     Most recent vital signs: Vitals:   01/08/24 2308 01/09/24 0100  BP: 120/72 113/69  Pulse: (!) 52 (!) 56  Resp: 20 14  Temp: 98.3 F (36.8 C)   SpO2: 99% 99%    General: Awake, no distress.  CV:  Good peripheral perfusion. Resp:  Normal effort.  Abd:  No distention.  Other:  Right upper quadrant and epigastric tenderness to palpation without rigidity or guarding.  Lung sounds normal, heart sounds normal no murmurs.  Radial pulses intact and equal bilaterally.   ED Results / Procedures / Treatments    Labs (all labs ordered are listed, but only abnormal results are displayed) Labs Reviewed  CBC WITH DIFFERENTIAL/PLATELET - Abnormal; Notable for the following components:      Result Value   WBC 11.4 (*)    RBC 3.83 (*)    RDW 11.1 (*)    Neutro Abs 8.6 (*)    All other components within normal limits  COMPREHENSIVE METABOLIC PANEL WITH GFR - Abnormal; Notable for the following components:   Glucose, Bld 166 (*)    All other components within normal limits  APTT - Abnormal; Notable for the following components:   aPTT 23 (*)    All other components within normal limits  PROTIME-INR  LIPASE, BLOOD  TROPONIN T, HIGH SENSITIVITY  TROPONIN T, HIGH SENSITIVITY     I ordered and reviewed the above labs they are notable for white blood cell count slightly elevated 11.4, LFTs normal.  EKG  ED ECG REPORT I, Ginnie Shams, the attending physician, personally viewed and interpreted this ECG.   Date: 01/08/2024  EKG Time: 2310  Rate: 53  Rhythm: sinus bradycardia  Axis: nl  Intervals:nl  ST&T Change: no stemi    RADIOLOGY I independently reviewed and interpreted right upper quadrant ultrasound see multiple gallstones I also reviewed radiologist's formal read.   PROCEDURES:  Critical Care performed: No  Procedures   MEDICATIONS ORDERED IN ED: Medications  ketorolac (TORADOL) 15 MG/ML injection 15 mg (15 mg Intravenous Given 01/09/24 0009)  ondansetron  (ZOFRAN ) injection 4 mg (4 mg Intravenous Given 01/09/24 0010)  sodium chloride  0.9 % bolus 500 mL (500 mLs Intravenous New Bag/Given 01/09/24 0010)  morphine  (PF) 2 MG/ML injection 2 mg (2 mg Intravenous Given 01/09/24 0119)    External physician / consultants:  I spoke with Dr. Jayson Endow regarding care plan for this patient.   IMPRESSION / MDM / ASSESSMENT AND PLAN / ED COURSE  I reviewed the triage vital signs and the nursing notes.                                Patient's presentation is most consistent  with acute presentation with potential threat to life or bodily function.  Differential diagnosis includes, but is not limited to, ACS, PE, dissection, biliary pathologies, pancreatitis, musculoskeletal pain, pneumothorax, respiratory infection   The patient is on the cardiac monitor to evaluate for evidence of arrhythmia and/or significant heart rate changes.  MDM:    Acute onset chest pain with right upper quadrant tenderness and associated nausea/vomiting most concerning for biliary pathologies like gallstones or cholecystitis.  Check right upper quadrant ultrasound.  Check lipase for pancreatitis.  Check EKG and serial troponins for ACS.  Considered but less likely dissection or pulmonary embolism.  Will give pain and antiemetic medications.   -- Cardiac workup unremarkable but does show evidence of gallstones on ultrasound with a prominent CBD but with normal liver enzymes bilirubin and alk phos I do not think this represents choledocholithiasis at this time.  Pain despite pain medications continues to be tender to the right upper quadrant.  Will keep NPO.  Call Dr. Sheppard Endow for admission for cholelithiasis.       FINAL CLINICAL IMPRESSION(S) / ED DIAGNOSES   Final diagnoses:  Nonspecific chest pain  Nausea and vomiting, unspecified vomiting type  Biliary colic  Calculus of gallbladder without cholecystitis without obstruction     Rx / DC Orders   ED Discharge Orders     None        Note:  This document was prepared using Dragon voice recognition software and may include unintentional dictation errors.    Cyrena Mylar, MD 01/09/24 [phone]

## 2024-01-08 NOTE — ED Triage Notes (Signed)
 Epigastric pain/pressure started about 3 hrs PTA while watching TV. No prior episodes of such. Denies nausea/diaphoresis/dyspnea. Pain non raidating--given 324mg  of ASA en route by EMS

## 2024-01-09 ENCOUNTER — Other Ambulatory Visit: Payer: Self-pay

## 2024-01-09 ENCOUNTER — Emergency Department

## 2024-01-09 ENCOUNTER — Encounter: Payer: Self-pay | Admitting: General Surgery

## 2024-01-09 ENCOUNTER — Encounter: Admission: EM | Disposition: A | Payer: Self-pay | Source: Home / Self Care | Attending: Emergency Medicine

## 2024-01-09 ENCOUNTER — Observation Stay: Admitting: Certified Registered Nurse Anesthetist

## 2024-01-09 DIAGNOSIS — K8000 Calculus of gallbladder with acute cholecystitis without obstruction: Secondary | ICD-10-CM

## 2024-01-09 DIAGNOSIS — I10 Essential (primary) hypertension: Secondary | ICD-10-CM | POA: Diagnosis not present

## 2024-01-09 DIAGNOSIS — K819 Cholecystitis, unspecified: Secondary | ICD-10-CM | POA: Diagnosis present

## 2024-01-09 DIAGNOSIS — K8020 Calculus of gallbladder without cholecystitis without obstruction: Secondary | ICD-10-CM | POA: Diagnosis not present

## 2024-01-09 DIAGNOSIS — K8050 Calculus of bile duct without cholangitis or cholecystitis without obstruction: Secondary | ICD-10-CM

## 2024-01-09 DIAGNOSIS — R079 Chest pain, unspecified: Secondary | ICD-10-CM | POA: Diagnosis not present

## 2024-01-09 DIAGNOSIS — E66813 Obesity, class 3: Secondary | ICD-10-CM | POA: Diagnosis not present

## 2024-01-09 DIAGNOSIS — Z87891 Personal history of nicotine dependence: Secondary | ICD-10-CM | POA: Diagnosis not present

## 2024-01-09 DIAGNOSIS — K8010 Calculus of gallbladder with chronic cholecystitis without obstruction: Secondary | ICD-10-CM | POA: Diagnosis not present

## 2024-01-09 DIAGNOSIS — Z6833 Body mass index (BMI) 33.0-33.9, adult: Secondary | ICD-10-CM | POA: Diagnosis not present

## 2024-01-09 HISTORY — PX: INDOCYANINE GREEN FLUORESCENCE IMAGING (ICG): SHX7595

## 2024-01-09 LAB — LIPASE, BLOOD: Lipase: 19 U/L (ref 11–51)

## 2024-01-09 LAB — TROPONIN T, HIGH SENSITIVITY: Troponin T High Sensitivity: <15 ng/L (ref 0–19)

## 2024-01-09 SURGERY — CHOLECYSTECTOMY, ROBOT-ASSISTED, LAPAROSCOPIC
Anesthesia: General

## 2024-01-09 MED ORDER — ACETAMINOPHEN 10 MG/ML IV SOLN
INTRAVENOUS | Status: AC
  Filled 2024-01-09: qty 100

## 2024-01-09 MED ORDER — MIDAZOLAM HCL 2 MG/2ML IJ SOLN
INTRAMUSCULAR | Status: AC
  Filled 2024-01-09: qty 2

## 2024-01-09 MED ORDER — OXYCODONE HCL 5 MG PO TABS: 5.0000 mg | ORAL_TABLET | ORAL | Status: DC | PRN

## 2024-01-09 MED ORDER — ONDANSETRON 4 MG PO TBDP: 4.0000 mg | ORAL_TABLET | Freq: Four times a day (QID) | ORAL | Status: DC | PRN

## 2024-01-09 MED ORDER — ONDANSETRON HCL 4 MG/2ML IJ SOLN: 4.0000 mg | Freq: Four times a day (QID) | INTRAMUSCULAR | Status: DC | PRN

## 2024-01-09 MED ORDER — INDOCYANINE GREEN 25 MG IV SOLR
1.2500 mg | INTRAVENOUS | Status: AC
  Administered 2024-01-09: 1.25 mg via INTRAVENOUS

## 2024-01-09 MED ORDER — ONDANSETRON HCL 4 MG/2ML IJ SOLN
INTRAMUSCULAR | Status: AC
  Filled 2024-01-09: qty 2

## 2024-01-09 MED ORDER — FENTANYL CITRATE (PF) 100 MCG/2ML IJ SOLN
INTRAMUSCULAR | Status: AC
  Filled 2024-01-09: qty 2

## 2024-01-09 MED ORDER — PHENYLEPHRINE 80 MCG/ML (10ML) SYRINGE FOR IV PUSH (FOR BLOOD PRESSURE SUPPORT)
PREFILLED_SYRINGE | INTRAVENOUS | Status: DC | PRN
  Administered 2024-01-09: 80 ug via INTRAVENOUS
  Administered 2024-01-09: 160 ug via INTRAVENOUS

## 2024-01-09 MED ORDER — PROPOFOL 1000 MG/100ML IV EMUL
INTRAVENOUS | Status: AC
Start: 2024-01-09 — End: 2024-01-09
  Filled 2024-01-09: qty 100

## 2024-01-09 MED ORDER — PROPOFOL 10 MG/ML IV BOLUS
INTRAVENOUS | Status: AC
  Filled 2024-01-09: qty 40

## 2024-01-09 MED ORDER — BUPIVACAINE-EPINEPHRINE (PF) 0.5% -1:200000 IJ SOLN
INTRAMUSCULAR | Status: DC | PRN
  Administered 2024-01-09: 30 mL

## 2024-01-09 MED ORDER — ACETAMINOPHEN 650 MG RE SUPP: 650.0000 mg | Freq: Four times a day (QID) | RECTAL | Status: DC | PRN

## 2024-01-09 MED ORDER — DEXMEDETOMIDINE HCL IN NACL 80 MCG/20ML IV SOLN
INTRAVENOUS | Status: AC
  Filled 2024-01-09: qty 20

## 2024-01-09 MED ORDER — PROPOFOL 10 MG/ML IV BOLUS
INTRAVENOUS | Status: DC | PRN
Start: 2024-01-09 — End: 2024-01-09
  Administered 2024-01-09: 100 mg via INTRAVENOUS

## 2024-01-09 MED ORDER — SUGAMMADEX SODIUM 200 MG/2ML IV SOLN
INTRAVENOUS | Status: DC | PRN
  Administered 2024-01-09: 200 mg via INTRAVENOUS

## 2024-01-09 MED ORDER — LIDOCAINE HCL (PF) 2 % IJ SOLN
INTRAMUSCULAR | Status: AC
  Filled 2024-01-09: qty 5

## 2024-01-09 MED ORDER — DEXMEDETOMIDINE HCL IN NACL 80 MCG/20ML IV SOLN
INTRAVENOUS | Status: DC | PRN
  Administered 2024-01-09: 8 ug via INTRAVENOUS

## 2024-01-09 MED ORDER — DEXAMETHASONE SOD PHOSPHATE PF 10 MG/ML IJ SOLN
INTRAMUSCULAR | Status: DC | PRN
  Administered 2024-01-09: 10 mg via INTRAVENOUS

## 2024-01-09 MED ORDER — FENTANYL CITRATE (PF) 100 MCG/2ML IJ SOLN
INTRAMUSCULAR | Status: DC | PRN
  Administered 2024-01-09 (×2): 50 ug via INTRAVENOUS

## 2024-01-09 MED ORDER — SODIUM CHLORIDE 0.9 % IV SOLN
2.0000 g | INTRAVENOUS | Status: DC
  Administered 2024-01-09: 2 g via INTRAVENOUS
  Filled 2024-01-09: qty 20

## 2024-01-09 MED ORDER — ACETAMINOPHEN 10 MG/ML IV SOLN
INTRAVENOUS | Status: DC | PRN
Start: 2024-01-09 — End: 2024-01-09
  Administered 2024-01-09: 1000 mg via INTRAVENOUS

## 2024-01-09 MED ORDER — HYDROMORPHONE HCL 1 MG/ML IJ SOLN: 0.5000 mg | INTRAMUSCULAR | Status: DC | PRN

## 2024-01-09 MED ORDER — ROCURONIUM BROMIDE 10 MG/ML (PF) SYRINGE
PREFILLED_SYRINGE | INTRAVENOUS | Status: AC
  Filled 2024-01-09: qty 10

## 2024-01-09 MED ORDER — VITAMIN B-12 1000 MCG PO TABS
500.0000 ug | ORAL_TABLET | Freq: Every day | ORAL | Status: DC
  Administered 2024-01-09 – 2024-01-10 (×2): 500 ug via ORAL
  Filled 2024-01-09 (×2): qty 1

## 2024-01-09 MED ORDER — LIDOCAINE HCL (CARDIAC) PF 100 MG/5ML IV SOSY
PREFILLED_SYRINGE | INTRAVENOUS | Status: DC | PRN
  Administered 2024-01-09: 100 mg via INTRAVENOUS

## 2024-01-09 MED ORDER — ACETAMINOPHEN 325 MG PO TABS: 650.0000 mg | ORAL_TABLET | Freq: Four times a day (QID) | ORAL | Status: DC | PRN

## 2024-01-09 MED ORDER — PROPOFOL 1000 MG/100ML IV EMUL
INTRAVENOUS | Status: AC
  Filled 2024-01-09: qty 100

## 2024-01-09 MED ORDER — PHENYLEPHRINE 80 MCG/ML (10ML) SYRINGE FOR IV PUSH (FOR BLOOD PRESSURE SUPPORT)
PREFILLED_SYRINGE | INTRAVENOUS | Status: AC
  Filled 2024-01-09: qty 10

## 2024-01-09 MED ORDER — ROCURONIUM BROMIDE 100 MG/10ML IV SOLN
INTRAVENOUS | Status: DC | PRN
  Administered 2024-01-09: 20 mg via INTRAVENOUS
  Administered 2024-01-09: 50 mg via INTRAVENOUS
  Administered 2024-01-09: 10 mg via INTRAVENOUS

## 2024-01-09 MED ORDER — BUPIVACAINE-EPINEPHRINE (PF) 0.5% -1:200000 IJ SOLN
INTRAMUSCULAR | Status: AC
  Filled 2024-01-09: qty 30

## 2024-01-09 MED ORDER — ONDANSETRON HCL 4 MG/2ML IJ SOLN
INTRAMUSCULAR | Status: DC | PRN
  Administered 2024-01-09: 4 mg via INTRAVENOUS

## 2024-01-09 MED ORDER — LACTATED RINGERS IV SOLN: INTRAVENOUS | Status: AC

## 2024-01-09 MED ORDER — CALCIUM CARBONATE 1250 (500 CA) MG PO TABS
1.0000 | ORAL_TABLET | Freq: Two times a day (BID) | ORAL | Status: DC
  Administered 2024-01-10: 1250 mg via ORAL
  Filled 2024-01-09: qty 1

## 2024-01-09 SURGICAL SUPPLY — 38 items
BAG PRESSURE INF REUSE 1000 (BAG) IMPLANT
CANNULA REDUCER 12-8 DVNC XI (CANNULA) ×2 IMPLANT
CAUTERY HOOK MNPLR 1.6 DVNC XI (INSTRUMENTS) ×2 IMPLANT
CLIP LIGATING HEMO O LOK GREEN (MISCELLANEOUS) ×2 IMPLANT
DEFOGGER SCOPE WARM SEASHARP (MISCELLANEOUS) ×2 IMPLANT
DERMABOND ADVANCED .7 DNX12 (GAUZE/BANDAGES/DRESSINGS) ×2 IMPLANT
DRAPE ARM DVNC X/XI (DISPOSABLE) ×8 IMPLANT
DRAPE COLUMN DVNC XI (DISPOSABLE) ×2 IMPLANT
ELECTRODE REM PT RTRN 9FT ADLT (ELECTROSURGICAL) ×2 IMPLANT
FORCEPS BPLR 8 MD DVNC XI (FORCEP) IMPLANT
FORCEPS BPLR R/ABLATION 8 DVNC (INSTRUMENTS) ×2 IMPLANT
FORCEPS PROGRASP DVNC XI (FORCEP) ×2 IMPLANT
GLOVE BIOGEL PI IND STRL 7.5 (GLOVE) ×4 IMPLANT
GLOVE SURG SYN 7.0 PF PI (GLOVE) ×4 IMPLANT
GOWN STRL REUS W/ TWL LRG LVL3 (GOWN DISPOSABLE) ×6 IMPLANT
GRASPER SUT TROCAR 14GX15 (MISCELLANEOUS) ×2 IMPLANT
IRRIGATION STRYKERFLOW (MISCELLANEOUS) IMPLANT
IRRIGATOR SUCT 8 DISP DVNC XI (IRRIGATION / IRRIGATOR) IMPLANT
IV 0.9% NACL 1000 ML (IV SOLUTION) IMPLANT
KIT PINK PAD W/HEAD ARM REST (MISCELLANEOUS) ×2 IMPLANT
LABEL OR SOLS (LABEL) ×2 IMPLANT
MANIFOLD NEPTUNE II (INSTRUMENTS) ×2 IMPLANT
NDL HYPO 22X1.5 SAFETY MO (MISCELLANEOUS) ×2 IMPLANT
NDL INSUFFLATION 14GA 120MM (NEEDLE) ×2 IMPLANT
NEEDLE HYPO 22X1.5 SAFETY MO (MISCELLANEOUS) ×2 IMPLANT
NEEDLE INSUFFLATION 14GA 120MM (NEEDLE) ×2 IMPLANT
NS IRRIG 500ML POUR BTL (IV SOLUTION) ×2 IMPLANT
OBTURATOR OPTICALSTD 8 DVNC (TROCAR) ×2 IMPLANT
PACK LAP CHOLECYSTECTOMY (MISCELLANEOUS) ×2 IMPLANT
SEAL UNIV 5-12 XI (MISCELLANEOUS) ×8 IMPLANT
SET TUBE SMOKE EVAC HIGH FLOW (TUBING) ×2 IMPLANT
SOLUTION ELECTROSURG ANTI STCK (MISCELLANEOUS) ×2 IMPLANT
SPIKE FLUID TRANSFER (MISCELLANEOUS) ×4 IMPLANT
SPONGE T-LAP 4X18 ~~LOC~~+RFID (SPONGE) IMPLANT
SUT VICRYL 0 UR6 27IN ABS (SUTURE) ×2 IMPLANT
SUTURE MNCRL 4-0 27XMF (SUTURE) ×2 IMPLANT
SYSTEM BAG RETRIEVAL 10MM (BASKET) ×2 IMPLANT
WATER STERILE IRR 500ML POUR (IV SOLUTION) ×2 IMPLANT

## 2024-01-09 NOTE — Anesthesia Postprocedure Evaluation (Signed)
 Anesthesia Post Note  Patient: Barbara Lewis  Procedure(s) Performed: CHOLECYSTECTOMY, ROBOT-ASSISTED, LAPAROSCOPIC INDOCYANINE GREEN FLUORESCENCE IMAGING ([facility])  Patient location during evaluation: PACU Anesthesia Type: General Level of consciousness: awake Pain management: satisfactory to patient Vital Signs Assessment: post-procedure vital signs reviewed and stable Respiratory status: spontaneous breathing Cardiovascular status: stable Anesthetic complications: no   No notable events documented.   Last Vitals:  Vitals:   01/09/24 1407 01/09/24 1415  BP: 133/64 (!) 128/53  Pulse: 88 86  Resp: 16 (!) 9  Temp: 36.9 C   SpO2: 100% 100%    Last Pain:  Vitals:   01/09/24 1402  TempSrc:   PainSc: Asleep                 VAN Lewis,Barbara Suk

## 2024-01-09 NOTE — H&P (Signed)
 [facility] SURGICAL HISTORY & PHYSICAL (cpt [phone])  HISTORY OF PRESENT ILLNESS (HPI):  73 y.o. female presented to [facility] ED last night for chest pain. Patient reports the acute onset of epigastric and lower chest discomfort last night after eating dinner. This radiated to her back. Pain was accompanied by nausea and emesis as well. No fever, chills, SOB, urinary changes,bowel changes, or juandice. She reports she may have had similar, less severe, episodes in the past but thought nothing of these. No previous abdominal surgeries. Work up in the ED revealed a leukocytosis to 11.4K, LFTs and bilirubin were normal. RUQ US  with cholelithiasis and question of wall thickening.   General surgery is consulted by emergency medicine physician Dr Ginnie Shams, MD for evaluation and management pf acute cholecystitis  PAST MEDICAL HISTORY (PMH):  Past Medical History:  Diagnosis Date   Anemia    Hematuria, microscopic    History of COVID-19 03/2021   Hypertension    Urinary frequency    Urinary incontinence     Reviewed. Otherwise negative.   PAST SURGICAL HISTORY [facility]):  Past Surgical History:  Procedure Laterality Date   CATARACT EXTRACTION W/PHACO Left 06/21/2021   Procedure: CATARACT EXTRACTION PHACO AND INTRAOCULAR LENS PLACEMENT (IOC) LEFT;  Surgeon: Mittie Gaskin, MD;  Location: [facility];  Service: Ophthalmology;  Laterality: Left;  5.03 01:13.8   CATARACT EXTRACTION W/PHACO Right 07/05/2021   Procedure: CATARACT EXTRACTION PHACO AND INTRAOCULAR LENS PLACEMENT (IOC) RIGHT 6.82 00:53.2;  Surgeon: Mittie Gaskin, MD;  Location: [facility];  Service: Ophthalmology;  Laterality: Right;  wants 8 or 9 arrival   COLONOSCOPY WITH PROPOFOL  N/A 07/15/2015   Procedure: COLONOSCOPY WITH PROPOFOL ;  Surgeon: Gladis RAYMOND Mariner, MD;  Location: [facility] END[facility]COPY;  Service: Endoscopy;  Laterality: N/A;   CYSTOSCOPY WITH BIOPSY N/A 09/13/2014   Procedure: CYSTOSCOPY  WITH BIOPSY;  Surgeon: Charlie JONETTA Pack, MD;  Location: [facility] ORS;  Service: Urology;  Laterality: N/A;    Reviewed. Otherwise negative.   MEDICATIONS:  Prior to Admission medications   Medication Sig Start Date End Date Taking? Authorizing Provider  Calcium Carb-Cholecalciferol 600-10 MG-MCG TABS Take 1 tablet by mouth daily at 12 noon. 04/30/23  Yes [provider]  cyanocobalamin  (VITAMIN B12) 1000 MCG tablet Take 1,000 mcg by mouth daily. 06/15/22  Yes [provider]  dorzolamide (TRUSOPT) 2 % ophthalmic solution Place 1 drop into both eyes 2 (two) times daily. 12/13/23  Yes [provider]  lisinopril -hydrochlorothiazide  (PRINZIDE ,ZESTORETIC ) 10-12.5 MG per tablet TAKE 1 TABLET BY MOUTH ONCE A DAY 08/27/14  Yes [provider]  conjugated estrogens  (PREMARIN ) vaginal cream apply 0.5mg  (pea-sized amount)  just inside the vaginal introitus with a finger-tip every night for two weeks and then Monday, Wednesday and Friday nights. Patient not taking: Reported on 02/11/2015 11/19/14   Helon Kirsch A, PA-C  mirabegron  ER (MYRBETRIQ ) 50 MG TB24 tablet Take 1 tablet (50 mg total) by mouth daily. Patient not taking: Reported on 02/11/2015 11/19/14   Helon Kirsch A, PA-C     ALLERGIES:  No Known Allergies   SOCIAL HISTORY:  Social History   Socioeconomic History   Marital status: Widowed    Spouse name: Not on file   Number of children: Not on file   Years of education: Not on file   Highest education level: Not on file  Occupational History   Not on file  Tobacco Use   Smoking status: Former    Current packs/day: 0.00  Types: Cigarettes    Quit date: 07/28/2010    Years since quitting: 13.4   Smokeless tobacco: Not on file  Substance and Sexual Activity   Alcohol use: Yes    Alcohol/week: 1.0 standard drink of alcohol    Types: 1 Standard drinks or equivalent per week   Drug use: No   Sexual activity: Not on file  Other Topics Concern   Not  on file  Social History Narrative   Not on file   Social Drivers of Health   Financial Resource Strain: Low Risk  (11/03/2023)   Received from [facility]   Overall Financial Resource Strain (CARDIA)    Difficulty of Paying Living Expenses: Not very hard  Food Insecurity: No Food Insecurity (01/09/2024)   Hunger Vital Sign    Worried About Running Out of Food in the Last Year: Never true    Ran Out of Food in the Last Year: Never true  Transportation Needs: No Transportation Needs (01/09/2024)   PRAPARE - Administrator, Civil Service (Medical): No    Lack of Transportation (Non-Medical): No  Physical Activity: Not on file  Stress: Not on file  Social Connections: Moderately Integrated (01/09/2024)   Social Connection and Isolation Panel    Frequency of Communication with Friends and Family: More than three times a week    Frequency of Social Gatherings with Friends and Family: More than three times a week    Attends Religious Services: More than 4 times per year    Active Member of [employer] or Organizations: Yes    Attends Banker Meetings: More than 4 times per year    Marital Status: Widowed  Intimate Partner Violence: Not At Risk (01/09/2024)   Humiliation, Afraid, Rape, and Kick questionnaire    Fear of Current or Ex-Partner: No    Emotionally Abused: No    Physically Abused: No    Sexually Abused: No     FAMILY HISTORY:  Family History  Problem Relation Age of Onset   Dementia Mother    Kidney cancer Neg Hx    Prostate cancer Neg Hx    Breast cancer Neg Hx     Otherwise negative.   REVIEW OF SYSTEMS:  Review of Systems  Constitutional:  Negative for chills and fever.  Respiratory:  Negative for cough and shortness of breath.   Cardiovascular:  Negative for chest pain and palpitations.  Gastrointestinal:  Positive for abdominal pain, nausea and vomiting. Negative for constipation and diarrhea.  Genitourinary:  Negative for  dysuria and urgency.  All other systems reviewed and are negative.   VITAL SIGNS:  Temp:  [98.3 F (36.8 C)-98.9 F (37.2 C)] 98.9 F (37.2 C) (11/13 0831) Pulse Rate:  [52-81] 73 (11/13 0831) Resp:  [14-20] 18 (11/13 0831) BP: (101-129)/(47-72) 101/47 (11/13 0831) SpO2:  [94 %-100 %] 94 % (11/13 0831) Weight:  [84.2 kg] 84.2 kg (11/12 2312)     Height: 5' 2 (157.5 cm) Weight: 84.2 kg BMI (Calculated): 33.94   PHYSICAL EXAM:  Physical Exam Vitals and nursing note reviewed. Exam conducted with a chaperone present.  Constitutional:      General: She is not in acute distress.    Appearance: Normal appearance. She is not ill-appearing.     Comments: Resting in bed; NAD  HENT:     Head: Normocephalic and atraumatic.  Eyes:     General: No scleral icterus.    Conjunctiva/sclera: Conjunctivae normal.  Comments: Wearing glasses   Cardiovascular:     Rate and Rhythm: Normal rate.     Pulses: Normal pulses.  Pulmonary:     Effort: Pulmonary effort is normal. No respiratory distress.  Abdominal:     General: Abdomen is flat. There is no distension.     Tenderness: There is abdominal tenderness in the right upper quadrant and epigastric area. There is no guarding or rebound. Negative signs include Murphy's sign.     Comments: Abdomen is soft, she is tender in epigastrium and RUQ without Murphy's Sign, non-distended, no rebound/guarding.   Genitourinary:    Comments: Deferred Musculoskeletal:     Right lower leg: No edema.     Left lower leg: No edema.  Skin:    General: Skin is warm and dry.     Coloration: Skin is not jaundiced.  Neurological:     General: No focal deficit present.     Mental Status: She is alert and oriented to person, place, and time.  Psychiatric:        Mood and Affect: Mood normal.        Behavior: Behavior normal.     INTAKE/OUTPUT:  This shift: No intake/output data recorded.  Last 2 shifts: @IOLAST2SHIFTS @  Labs:     Latest Ref Rng & Units  01/08/2024   11:29 PM  CBC  WBC 4.0 - 10.5 K/uL 11.4   Hemoglobin 12.0 - 15.0 g/dL 87.5   Hematocrit 63.9 - 46.0 % 37.0   Platelets 150 - 400 K/uL 213       Latest Ref Rng & Units 01/08/2024   11:29 PM 09/06/2014    9:29 AM  CMP  Glucose 70 - 99 mg/dL 833    BUN 8 - 23 mg/dL 18    Creatinine 9.55 - 1.00 mg/dL 9.25    Sodium 864 - 854 mmol/L 142    Potassium 3.5 - 5.1 mmol/L 3.6  4.1   Chloride 98 - 111 mmol/L 105    CO2 22 - 32 mmol/L 27    Calcium 8.9 - 10.3 mg/dL 9.3    Total Protein 6.5 - 8.1 g/dL 6.5    Total Bilirubin 0.0 - 1.2 mg/dL 0.4    Alkaline Phos 38 - 126 U/L 71    AST 15 - 41 U/L 15    ALT 0 - 44 U/L 13       Imaging studies:   RUQ US  (01/09/2024) personally reviewed with noted cholelithiasis, and radiologist report reviewed below:  IMPRESSION: Cholelithiasis. Prominent CBD. Correlate with laboratory values.   Assessment/Plan: (ICD-10's: K81.0) 73 y.o. female with acute cholecystitis vs symptomatic cholelithiasis, no clinical or laboratory evidence to suggest choledocholithiasis    - We will plan for robotic assisted laparoscopic cholecystectomy with Dr Marinda today pending OR/Anesthesia availability  - All risks, benefits, and alternatives to above procedure(s) were discussed with the patient, all of her questions were answered to her expressed satisfaction, patient expresses she wishes to proceed, and informed consent was obtained.    - NPO + IVF support  - IV Abx (Rocephin)  - ICG on call to OR  - Monitor abdominal examination; on-going bowel function  - Pain control prn; antiemetics prn   All of the above findings and recommendations were discussed with the patient, and all of her questions were answered to her expressed satisfaction.  -- Arthea Platt, PA-C [facility] 01/09/2024, 8:41 AM M-F: 7am - 4pm

## 2024-01-09 NOTE — Progress Notes (Signed)
   01/09/24 1057  Spiritual Encounters  Type of Visit Initial  Care provided to: Pt and family  Referral source Clinical staff  Reason for visit Advance directives  OnCall Visit No  Advance Directives (For Healthcare)  Does Patient Have a Medical Advance Directive? No  Would patient like information on creating a medical advance directive? Yes (Inpatient - patient defers creating a medical advance directive at this time - Information given)   Responded to Spiritual Care consult for AD information.  Provided paperwork and explained information.  Patient will discuss with family prior to paperwork completion.

## 2024-01-09 NOTE — Anesthesia Procedure Notes (Signed)
 Procedure Name: Intubation Date/Time: 01/09/2024 12:13 PM  Performed by: Lorrene Camelia LABOR, CRNAPre-anesthesia Checklist: Patient identified, Patient being monitored, Timeout performed, Emergency Drugs available and Suction available Patient Re-evaluated:Patient Re-evaluated prior to induction Oxygen Delivery Method: Circle system utilized Preoxygenation: Pre-oxygenation with 100% oxygen Induction Type: IV induction Ventilation: Mask ventilation without difficulty Laryngoscope Size: McGrath and 4 Grade View: Grade I Tube type: Oral Tube size: 7.5 mm Number of attempts: 1 Airway Equipment and Method: Stylet Placement Confirmation: ETT inserted through vocal cords under direct vision, positive ETCO2 and breath sounds checked- equal and bilateral Secured at: 21 cm Tube secured with: Tape Dental Injury: Teeth and Oropharynx as per pre-operative assessment

## 2024-01-09 NOTE — Anesthesia Preprocedure Evaluation (Addendum)
 Anesthesia Evaluation  Patient identified by MRN, date of birth, ID band Patient awake    Reviewed: Allergy & Precautions, H&P , NPO status , Patient's Chart, lab work & pertinent test results  Airway Mallampati: III  TM Distance: >3 FB Neck ROM: full    Dental no notable dental hx. (+) Teeth Intact   Pulmonary neg pulmonary ROS, Patient abstained from smoking., former smoker   Pulmonary exam normal breath sounds clear to auscultation       Cardiovascular Exercise Tolerance: Good hypertension, Pt. on medications negative cardio ROS Normal cardiovascular exam Rhythm:Regular     Neuro/Psych negative neurological ROS  negative psych ROS   GI/Hepatic negative GI ROS, Neg liver ROS,,,  Endo/Other  negative endocrine ROS  Class 3 obesity  Renal/GU   negative genitourinary   Musculoskeletal   Abdominal  (+) + obese  Peds  Hematology negative hematology ROS (+) Blood dyscrasia, anemia   Anesthesia Other Findings Past Medical History: No date: Anemia No date: Hematuria, microscopic 03/2021: History of COVID-19 No date: Hypertension No date: Urinary frequency No date: Urinary incontinence  Past Surgical History: 06/21/2021: CATARACT EXTRACTION W/PHACO; Left     Comment:  Procedure: CATARACT EXTRACTION PHACO AND INTRAOCULAR               LENS PLACEMENT (IOC) LEFT;  Surgeon: Mittie Gaskin, MD;  Location: [facility];  Service:               Ophthalmology;  Laterality: Left;  5.03 01:13.8 07/05/2021: CATARACT EXTRACTION W/PHACO; Right     Comment:  Procedure: CATARACT EXTRACTION PHACO AND INTRAOCULAR               LENS PLACEMENT (IOC) RIGHT 6.82 00:53.2;  Surgeon:               Mittie Gaskin, MD;  Location: [facility];              Service: Ophthalmology;  Laterality: Right;  wants 8 or 9              arrival 07/15/2015: COLONOSCOPY WITH PROPOFOL ; N/A     Comment:  Procedure:  COLONOSCOPY WITH PROPOFOL ;  Surgeon: Gladis RAYMOND Mariner, MD;  Location: [facility] ENDOSCOPY;  Service:               Endoscopy;  Laterality: N/A; 09/13/2014: CYSTOSCOPY WITH BIOPSY; N/A     Comment:  Procedure: CYSTOSCOPY WITH BIOPSY;  Surgeon: Charlie JONETTA Pack, MD;  Location: [facility] ORS;  Service: Urology;                Laterality: N/A;  BMI    Body Mass Index: 33.95 kg/m      Reproductive/Obstetrics negative OB ROS                              Anesthesia Physical Anesthesia Plan  ASA: 2  Anesthesia Plan: General ETT and General   Post-op Pain Management: Ofirmev  IV (intra-op)* and Toradol IV (intra-op)*   Induction: Intravenous  PONV Risk Score and Plan: 3 and Ondansetron , Dexamethasone , Midazolam  and Propofol  infusion  Airway Management Planned: Oral ETT  Additional Equipment:   Intra-op Plan:   Post-operative Plan: Extubation  in OR  Informed Consent: I have reviewed the patients History and Physical, chart, labs and discussed the procedure including the risks, benefits and alternatives for the proposed anesthesia with the patient or authorized representative who has indicated his/her understanding and acceptance.     Dental Advisory Given  Plan Discussed with: CRNA and Surgeon  Anesthesia Plan Comments:          Anesthesia Quick Evaluation

## 2024-01-09 NOTE — Transfer of Care (Signed)
 Immediate Anesthesia Transfer of Care Note  Patient: Barbara Lewis  Procedure(s) Performed: CHOLECYSTECTOMY, ROBOT-ASSISTED, LAPAROSCOPIC INDOCYANINE GREEN FLUORESCENCE IMAGING (ICG)  Patient Location: PACU  Anesthesia Type:General  Level of Consciousness: drowsy and patient cooperative  Airway & Oxygen Therapy: Patient Spontanous Breathing and Patient connected to face mask oxygen  Post-op Assessment: Report given to RN and Post -op Vital signs reviewed and stable  Post vital signs: Reviewed and stable  Last Vitals:  Vitals Value Taken Time  BP 133/64 01/09/24 14:07  Temp 36.9 C 01/09/24 14:07  Pulse 88 01/09/24 14:07  Resp 16 01/09/24 14:07  SpO2 100 % 01/09/24 14:07    Last Pain:  Vitals:   01/09/24 1402  TempSrc:   PainSc: Asleep      Patients Stated Pain Goal: 0 (01/08/24 2308)  Complications: No notable events documented.

## 2024-01-09 NOTE — TOC CM/SW Note (Signed)
 Transition of Care [facility]) - Inpatient Brief Assessment   Patient Details  Name: KALYIAH SAINTIL MRN: [id number] Date of Birth: 10/10/1950  Transition of Care [facility]) CM/SW Contact:    Nathanael CHRISTELLA Ring, RN Phone Number: 01/09/2024, 10:04 AM   Clinical Narrative:   Transition of Care [facility]) Screening Note   Patient Details  Name: WYNONIA MEDERO Date of Birth: 1951/02/24   Transition of Care [facility]) CM/SW Contact:    Nathanael CHRISTELLA Ring, RN Phone Number: 01/09/2024, 10:04 AM    Transition of Care Department [facility]) has reviewed patient and no TOC needs have been identified at this time.  If new patient transition needs arise, please place a TOC consult.    Transition of Care Asessment: Insurance and Status: Insurance coverage has been reviewed Patient has primary care physician: Yes     Prior/Current Home Services: No current home services Social Drivers of Health Review: SDOH reviewed no interventions necessary Readmission risk has been reviewed: No (Currently in obs no readamission risk score generated) Transition of care needs: no transition of care needs at this time

## 2024-01-09 NOTE — Op Note (Signed)
 Robotic assisted laparoscopic Cholecystectomy  Pre-operative Diagnosis: Acute Cholecystitis  Post-operative Diagnosis: Same  Procedure:  Robotic assisted laparoscopic Cholecystectomy  Surgeon: Jayson Endow, MD  Anesthesia: Gen. with endotracheal tube  Findings: Inlfamed gallbladder with multiple stones in it, critical view of safety obtained  Estimated Blood Loss: 15cc       Specimens: Gallbladder           Complications: none   Procedure Details  The patient was seen again in the Holding Room. The benefits, complications, treatment options, and expected outcomes were discussed with the patient. The risks of bleeding, infection, recurrence of symptoms, failure to resolve symptoms, bile duct damage, bile duct leak, retained common bile duct stone, bowel injury, any of which could require further surgery and/or ERCP, stent, or papillotomy were reviewed with the patient. The likelihood of improving the patient's symptoms with return to their baseline status is good.  The patient and/or family concurred with the proposed plan, giving informed consent.  The patient was taken to Operating Room, identified  and the procedure verified as robotic Cholecystectomy.  A Time Out was held and the above information confirmed.  Prior to the induction of general anesthesia, antibiotic prophylaxis was administered. VTE prophylaxis was in place. General endotracheal anesthesia was then administered and tolerated well. After the induction, the abdomen was prepped with Chloraprep and draped in the sterile fashion. The patient was positioned in the supine position.  A veress needle was inserted into the abdomen using standard drop technique. An 8mm infra-umbilical robotic port was then placed under direct visualization. There was no injury noted at the site of veress needle insertion. Two right sided abdominal 8mm ports followed by an 8mm left abdominal robotic ports were placed under direct visualization.  The left sided abdominal port was then upsized to a 12mm robotic port.  The patient was positioned  in reverse Trendelenburg, robot was brought to the surgical field and docked in the standard fashion.  We made sure all the instrumentation was kept indirect view at all times and that there were no collision between the arms. I scrubbed out and went to the console.  The gallbladder was identified, the fundus grasped and retracted cephalad. Adhesions were lysed bluntly. The infundibulum was grasped and retracted laterally, exposing the peritoneum overlying the triangle of Calot. This was then divided and exposed in a blunt fashion. An extended critical view of the cystic duct and cystic artery was obtained.  The cystic duct was clearly identified and bluntly dissected.   Artery and duct were double clipped and divided. Using ICG cholangiography we visualized the cystic duct. The gallbladder was taken from the gallbladder fossa in a retrograde fashion with the electrocautery.  Hemostasis was achieved with the electrocautery. nspection of the right upper quadrant was performed. No bleeding, bile duct injury or leak, or bowel injury was noted. Robotic instruments and robotic arms were undocked in the standard fashion.  I scrubbed back in.  The gallbladder was removed and placed in an Endocatch bag.   The left lower quadrant fascia was then closed with a 0 vicryl. The pre-peritoneal space was then infiltrated with liposomal bupivicaine and marcaine  solution. Pneumoperitoneum was released.  4-0 subcuticular Monocryl was used to close the skin. Dermabond was  applied.  The patient was then extubated and brought to the recovery room in stable condition. Sponge, lap, and needle counts were correct at closure and at the conclusion of the case.  Jayson Endow, M.D. [facility]

## 2024-01-09 NOTE — ED Notes (Signed)
 Ultrasound at bedside

## 2024-01-10 ENCOUNTER — Other Ambulatory Visit: Payer: Self-pay

## 2024-01-10 ENCOUNTER — Encounter: Payer: Self-pay | Admitting: General Surgery

## 2024-01-10 MED ORDER — DOCUSATE SODIUM 100 MG PO CAPS
100.0000 mg | ORAL_CAPSULE | Freq: Once | ORAL | Status: AC
  Administered 2024-01-10: 100 mg via ORAL
  Filled 2024-01-10: qty 1

## 2024-01-10 MED ORDER — DOCUSATE SODIUM 100 MG PO CAPS
100.0000 mg | ORAL_CAPSULE | Freq: Every day | ORAL | 0 refills | Status: AC | PRN
  Filled 2024-01-10: qty 30, 30d supply, fill #0

## 2024-01-10 MED ORDER — OXYCODONE HCL 5 MG PO TABS: 5.0000 mg | ORAL_TABLET | ORAL | 0 refills | Status: AC | PRN

## 2024-01-10 NOTE — Plan of Care (Signed)
 IV removed, discharge instructions reviewed and patient discharged to home with niece

## 2024-01-10 NOTE — Discharge Summary (Signed)
 Patient ID: ALAZAE CRYMES MRN: [id number] DOB/AGE: 04/10/1950 73 y.o.  Admit date: 01/08/2024 Discharge date: 01/10/2024   Discharge Diagnoses:  Principal Problem:   Cholecystitis Active Problems:   Calculus of gallbladder without cholecystitis without obstruction   Biliary colic   Procedures:Robotic assisted cholecystectomy  Hospital Course:   admitted with findings consistent with acute cholecystitis and  was taken promptly to the operating room for an uneventful laparoscopic robotic cholecystectomy.  Patient was kept overnight.  The time of discharge the patient was ambulating,  pain was controlled.  Her vital signs were stable and she was afebrile.   physical exam at discharge showed a pt  in no acute distress.  Awake and alert.  Abdomen: Soft incisions healing well without infection or peritonitis.  Extremities well-perfused and no edema.  Condition of the patient the time of discharge was stable   Consults: None  Disposition: Discharge disposition: 01-Home or Self Care       Discharge Instructions     Call MD for:  difficulty breathing, headache or visual disturbances   Complete by: As directed    Call MD for:  extreme fatigue   Complete by: As directed    Call MD for:  hives   Complete by: As directed    Call MD for:  persistant dizziness or light-headedness   Complete by: As directed    Call MD for:  persistant nausea and vomiting   Complete by: As directed    Call MD for:  redness, tenderness, or signs of infection (pain, swelling, redness, odor or green/yellow discharge around incision site)   Complete by: As directed    Call MD for:  severe uncontrolled pain   Complete by: As directed    Call MD for:  temperature >100.4   Complete by: As directed    Diet - low sodium heart healthy   Complete by: As directed    Discharge instructions   Complete by: As directed    You have surgical glue over your incisions.  This will peel off over the next 3 to 4 weeks.   You may shower on postoperative day 1.  Please let the warm water  run over your incisions and pat dry.  Please do not submerge the wounds in water  for 2 weeks.  No heavy lifting greater than 10 to 15 pounds.  You will follow-up with us  in the office in 2 weeks   Increase activity slowly   Complete by: As directed       Allergies as of 01/10/2024   No Known Allergies      Medication List     TAKE these medications    Calcium Carb-Cholecalciferol 600-10 MG-MCG Tabs Take 1 tablet by mouth daily at 12 noon.   conjugated estrogens  0.625 MG/GM vaginal cream Commonly known as: Premarin  apply 0.5mg  (pea-sized amount)  just inside the vaginal introitus with a finger-tip every night for two weeks and then Monday, Wednesday and Friday nights.   cyanocobalamin  1000 MCG tablet Commonly known as: VITAMIN B12 Take 1,000 mcg by mouth daily.   dorzolamide 2 % ophthalmic solution Commonly known as: TRUSOPT Place 1 drop into both eyes 2 (two) times daily.   lisinopril -hydrochlorothiazide  10-12.5 MG tablet Commonly known as: ZESTORETIC  TAKE 1 TABLET BY MOUTH ONCE A DAY   mirabegron  ER 50 MG Tb24 tablet Commonly known as: MYRBETRIQ  Take 1 tablet (50 mg total) by mouth daily.   oxyCODONE  5 MG immediate release tablet Commonly known as: Oxy IR/ROXICODONE  Take  1 tablet (5 mg total) by mouth every 4 (four) hours as needed for moderate pain (pain score 4-6).          Jayson Endow, M.D.

## 2024-01-13 LAB — SURGICAL PATHOLOGY

## 2024-01-21 ENCOUNTER — Ambulatory Visit: Admitting: General Surgery

## 2024-01-21 ENCOUNTER — Encounter: Payer: Self-pay | Admitting: General Surgery

## 2024-01-21 VITALS — BP 147/79 | HR 91 | Ht 62.0 in | Wt 184.0 lb

## 2024-01-21 DIAGNOSIS — K8000 Calculus of gallbladder with acute cholecystitis without obstruction: Secondary | ICD-10-CM

## 2024-01-21 DIAGNOSIS — K819 Cholecystitis, unspecified: Secondary | ICD-10-CM

## 2024-01-21 NOTE — Patient Instructions (Signed)

## 2024-01-30 NOTE — Progress Notes (Signed)
 Patient returns status post robotic assisted cholecystectomy.  She reports doing well.  She has a little bit of soreness of her incisions.  She denies any erythema or drainage.  She is tolerating a regular diet and having normal bowel function.  Abdomen is soft, nontender and nondistended, robotic port sites are healing well.  Pathology consistent with chronic cholecystitis.  Discussed lifting restrictions for another 2 weeks.  She can follow-up with us  as needed

## 2024-02-04 DIAGNOSIS — Z961 Presence of intraocular lens: Secondary | ICD-10-CM | POA: Diagnosis not present

## 2024-02-04 DIAGNOSIS — H401131 Primary open-angle glaucoma, bilateral, mild stage: Secondary | ICD-10-CM | POA: Diagnosis not present

## 2024-02-04 DIAGNOSIS — H02403 Unspecified ptosis of bilateral eyelids: Secondary | ICD-10-CM | POA: Diagnosis not present
